# Patient Record
Sex: Male | Born: 1970 | Race: White | Hispanic: No | Marital: Married | Smoking: Current every day smoker
Health system: Southern US, Community
[De-identification: ages and names within clinical notes are randomized; demographics above are authoritative.]

## PROBLEM LIST (undated history)

## (undated) DIAGNOSIS — K649 Unspecified hemorrhoids: Secondary | ICD-10-CM

## (undated) DIAGNOSIS — K6289 Other specified diseases of anus and rectum: Secondary | ICD-10-CM

## (undated) DIAGNOSIS — K602 Anal fissure, unspecified: Secondary | ICD-10-CM

## (undated) HISTORY — PX: TONSILLECTOMY: SUR1361

## (undated) HISTORY — PX: SHOULDER ARTHROSCOPY: SHX128

## (undated) HISTORY — PX: TYMPANOSTOMY TUBE PLACEMENT: SHX32

## (undated) HISTORY — PX: LAPAROSCOPIC APPENDECTOMY: SHX408

---

## 2006-01-13 HISTORY — PX: VASECTOMY: SHX75

## 2009-11-05 ENCOUNTER — Ambulatory Visit (HOSPITAL_COMMUNITY): Admission: EM | Admit: 2009-11-05 | Discharge: 2009-11-06 | Payer: Self-pay | Admitting: Emergency Medicine

## 2010-03-27 LAB — URINALYSIS, ROUTINE W REFLEX MICROSCOPIC
Bilirubin Urine: NEGATIVE
Hgb urine dipstick: NEGATIVE
Protein, ur: NEGATIVE mg/dL
Urobilinogen, UA: 0.2 mg/dL (ref 0.0–1.0)

## 2010-03-27 LAB — BASIC METABOLIC PANEL
CO2: 28 mEq/L (ref 19–32)
Calcium: 9 mg/dL (ref 8.4–10.5)
Creatinine, Ser: 1.02 mg/dL (ref 0.4–1.5)
GFR calc Af Amer: >60 mL/min (ref >60)

## 2010-03-27 LAB — TYPE AND SCREEN: ABO/RH(D): O POS

## 2015-12-20 DIAGNOSIS — L989 Disorder of the skin and subcutaneous tissue, unspecified: Secondary | ICD-10-CM | POA: Diagnosis not present

## 2015-12-20 DIAGNOSIS — M549 Dorsalgia, unspecified: Secondary | ICD-10-CM | POA: Diagnosis not present

## 2015-12-20 DIAGNOSIS — F39 Unspecified mood [affective] disorder: Secondary | ICD-10-CM | POA: Diagnosis not present

## 2016-03-31 DIAGNOSIS — Z0000 Encounter for general adult medical examination without abnormal findings: Secondary | ICD-10-CM | POA: Diagnosis not present

## 2016-04-16 DIAGNOSIS — L820 Inflamed seborrheic keratosis: Secondary | ICD-10-CM | POA: Diagnosis not present

## 2016-04-16 DIAGNOSIS — L814 Other melanin hyperpigmentation: Secondary | ICD-10-CM | POA: Diagnosis not present

## 2016-04-16 DIAGNOSIS — L821 Other seborrheic keratosis: Secondary | ICD-10-CM | POA: Diagnosis not present

## 2016-06-11 DIAGNOSIS — F39 Unspecified mood [affective] disorder: Secondary | ICD-10-CM | POA: Diagnosis not present

## 2016-07-09 DIAGNOSIS — E291 Testicular hypofunction: Secondary | ICD-10-CM | POA: Diagnosis not present

## 2016-08-20 DIAGNOSIS — E291 Testicular hypofunction: Secondary | ICD-10-CM | POA: Diagnosis not present

## 2017-07-24 DIAGNOSIS — M549 Dorsalgia, unspecified: Secondary | ICD-10-CM | POA: Diagnosis not present

## 2017-07-24 DIAGNOSIS — F39 Unspecified mood [affective] disorder: Secondary | ICD-10-CM | POA: Diagnosis not present

## 2017-07-24 DIAGNOSIS — R42 Dizziness and giddiness: Secondary | ICD-10-CM | POA: Diagnosis not present

## 2017-08-17 DIAGNOSIS — N289 Disorder of kidney and ureter, unspecified: Secondary | ICD-10-CM | POA: Diagnosis not present

## 2017-12-18 DIAGNOSIS — L309 Dermatitis, unspecified: Secondary | ICD-10-CM | POA: Diagnosis not present

## 2018-02-12 DIAGNOSIS — L309 Dermatitis, unspecified: Secondary | ICD-10-CM | POA: Diagnosis not present

## 2018-07-19 DIAGNOSIS — L814 Other melanin hyperpigmentation: Secondary | ICD-10-CM | POA: Diagnosis not present

## 2018-07-19 DIAGNOSIS — L819 Disorder of pigmentation, unspecified: Secondary | ICD-10-CM | POA: Diagnosis not present

## 2018-07-19 DIAGNOSIS — L810 Postinflammatory hyperpigmentation: Secondary | ICD-10-CM | POA: Diagnosis not present

## 2018-07-19 DIAGNOSIS — L438 Other lichen planus: Secondary | ICD-10-CM | POA: Diagnosis not present

## 2018-07-26 DIAGNOSIS — Z0000 Encounter for general adult medical examination without abnormal findings: Secondary | ICD-10-CM | POA: Diagnosis not present

## 2018-08-09 DIAGNOSIS — K602 Anal fissure, unspecified: Secondary | ICD-10-CM | POA: Diagnosis not present

## 2018-08-09 DIAGNOSIS — K6289 Other specified diseases of anus and rectum: Secondary | ICD-10-CM | POA: Diagnosis not present

## 2018-08-09 DIAGNOSIS — Z13220 Encounter for screening for lipoid disorders: Secondary | ICD-10-CM | POA: Diagnosis not present

## 2018-08-09 DIAGNOSIS — R7309 Other abnormal glucose: Secondary | ICD-10-CM | POA: Diagnosis not present

## 2018-08-09 DIAGNOSIS — K625 Hemorrhage of anus and rectum: Secondary | ICD-10-CM | POA: Diagnosis not present

## 2018-08-09 DIAGNOSIS — Z125 Encounter for screening for malignant neoplasm of prostate: Secondary | ICD-10-CM | POA: Diagnosis not present

## 2018-09-06 DIAGNOSIS — K641 Second degree hemorrhoids: Secondary | ICD-10-CM | POA: Diagnosis not present

## 2018-09-06 DIAGNOSIS — K6289 Other specified diseases of anus and rectum: Secondary | ICD-10-CM | POA: Diagnosis not present

## 2018-09-06 DIAGNOSIS — K602 Anal fissure, unspecified: Secondary | ICD-10-CM | POA: Diagnosis not present

## 2018-10-04 DIAGNOSIS — K641 Second degree hemorrhoids: Secondary | ICD-10-CM | POA: Diagnosis not present

## 2018-10-04 DIAGNOSIS — K6289 Other specified diseases of anus and rectum: Secondary | ICD-10-CM | POA: Diagnosis not present

## 2018-10-04 DIAGNOSIS — K602 Anal fissure, unspecified: Secondary | ICD-10-CM | POA: Diagnosis not present

## 2018-10-12 DIAGNOSIS — K602 Anal fissure, unspecified: Secondary | ICD-10-CM | POA: Diagnosis not present

## 2018-10-18 ENCOUNTER — Ambulatory Visit: Payer: Self-pay | Admitting: General Surgery

## 2018-10-18 ENCOUNTER — Other Ambulatory Visit: Payer: Self-pay | Admitting: General Surgery

## 2018-10-18 NOTE — H&P (Signed)
History of Present Illness (Milyn Stapleton MD; 10/12/2018 11:25 AM) The patient is a 48 year old male who presents with anal pain. 48-year-old male who presents to the office with complaints of anal pain for the last 6 months. Patient has a history of an anal fissure approximately 8 years ago. He states that the pain was very similar. He saw his gastroenterologist, Dr. Hung. He is diltiazem ointments for approximately 2 months after being diagnosed with a fissure. He stated that his pain initially got better, but then he developed new pain and swelling in the area. He was seen again by his gastroenterologist and was given a prescription for a steroid cream for swelling and his hemorrhoids. He reports that he has had minimal success with this and he is ready for further evaluation.   Past Surgical History (Kelly Dockery, LPN; 10/12/2018 11:18 AM) Appendectomy  Oral Surgery  Vasectomy   Diagnostic Studies History (Kelly Dockery, LPN; 10/12/2018 11:18 AM) Colonoscopy  never  Allergies (Kelly Dockery, LPN; 10/12/2018 11:20 AM) Penicillins  Rash.  Medication History (Kelly Dockery, LPN; 10/12/2018 11:20 AM) buPROPion HCl ER (SR) (150MG Tablet ER 12HR, Oral) Active. Hydrocortisone (2.5% Cream, External) Active. Medications Reconciled  Social History (Kelly Dockery, LPN; 10/12/2018 11:18 AM) Alcohol use  Occasional alcohol use. Caffeine use  Carbonated beverages, Coffee, Tea. Illicit drug use  Remotely quit drug use. Tobacco use  Former smoker.  Family History (Kelly Dockery, LPN; 10/12/2018 11:18 AM) Alcohol Abuse  Father. Arthritis  Mother. Heart Disease  Father. Hypertension  Brother. Thyroid problems  Mother.  Other Problems (Kelly Dockery, LPN; 10/12/2018 11:18 AM) Hemorrhoids     Review of Systems (Kelly Dockery LPN; 10/12/2018 11:18 AM) General Not Present- Appetite Loss, Chills, Fatigue, Fever, Night Sweats, Weight Gain and Weight Loss. Skin Not Present-  Change in Wart/Mole, Dryness, Hives, Jaundice, New Lesions, Non-Healing Wounds, Rash and Ulcer. HEENT Not Present- Earache, Hearing Loss, Hoarseness, Nose Bleed, Oral Ulcers, Ringing in the Ears, Seasonal Allergies, Sinus Pain, Sore Throat, Visual Disturbances, Wears glasses/contact lenses and Yellow Eyes. Respiratory Not Present- Bloody sputum, Chronic Cough, Difficulty Breathing, Snoring and Wheezing. Breast Not Present- Breast Mass, Breast Pain, Nipple Discharge and Skin Changes. Gastrointestinal Present- Hemorrhoids and Rectal Pain. Not Present- Abdominal Pain, Bloating, Bloody Stool, Change in Bowel Habits, Chronic diarrhea, Constipation, Difficulty Swallowing, Excessive gas, Gets full quickly at meals, Indigestion, Nausea and Vomiting. Male Genitourinary Not Present- Blood in Urine, Change in Urinary Stream, Frequency, Impotence, Nocturia, Painful Urination, Urgency and Urine Leakage. Musculoskeletal Not Present- Back Pain, Joint Pain, Joint Stiffness, Muscle Pain, Muscle Weakness and Swelling of Extremities. Neurological Not Present- Decreased Memory, Fainting, Headaches, Numbness, Seizures, Tingling, Tremor, Trouble walking and Weakness. Psychiatric Not Present- Anxiety, Bipolar, Change in Sleep Pattern, Depression, Fearful and Frequent crying. Endocrine Not Present- Cold Intolerance, Excessive Hunger, Hair Changes, Heat Intolerance, Hot flashes and New Diabetes. Hematology Not Present- Blood Thinners, Easy Bruising, Excessive bleeding, Gland problems, HIV and Persistent Infections.  Vitals (Kelly Dockery LPN; 10/12/2018 11:18 AM) 10/12/2018 11:18 AM Weight: 200.8 lb Height: 72in Body Surface Area: 2.13 m Body Mass Index: 27.23 kg/m  Temp.: 97.7F (Temporal)  Pulse: 114 (Regular)  BP: 142/90(Sitting, Left Arm, Standard)       Physical Exam (Mylene Bow MD; 10/12/2018 11:33 AM) General Mental Status-Alert. General  Appearance-Cooperative.  Abdomen Palpation/Percussion Palpation and Percussion of the abdomen reveal - Soft and Non Tender.  Rectal Anorectal Exam External - anal fissure. Note: Anterior thrombosed hemorrhoid, minimal tenderness to palpation. Internal - increased sphincter   tone.    Assessment & Plan (Matteson Blue MD; 10/12/2018 11:36 AM)  ANAL FISSURE (K60.2) Impression: 48-year-old male who presents to the office with anal pain for the past 6 months. He has been diagnosed with an anal fissure. His symptoms initially resolved but then returned. On exam today, he does have a persistent posterior midline anal fissure as well as a thrombosed hemorrhoid anteriorly. We discussed exam under anesthesia with probable chemical sphincterotomy. We also discussed possible thrombectomy of his hemorrhoid, if it is still a concern at the time of surgery. Risk include postoperative pain, bleeding and recurrence. 

## 2018-11-15 DIAGNOSIS — K602 Anal fissure, unspecified: Secondary | ICD-10-CM | POA: Diagnosis not present

## 2018-11-17 DIAGNOSIS — R5383 Other fatigue: Secondary | ICD-10-CM | POA: Diagnosis not present

## 2018-12-27 DIAGNOSIS — R002 Palpitations: Secondary | ICD-10-CM | POA: Diagnosis not present

## 2018-12-27 DIAGNOSIS — I998 Other disorder of circulatory system: Secondary | ICD-10-CM | POA: Diagnosis not present

## 2019-01-22 ENCOUNTER — Other Ambulatory Visit (HOSPITAL_COMMUNITY): Payer: Self-pay

## 2019-01-25 DIAGNOSIS — K602 Anal fissure, unspecified: Secondary | ICD-10-CM | POA: Diagnosis not present

## 2019-01-28 ENCOUNTER — Ambulatory Visit: Payer: Self-pay | Admitting: General Surgery

## 2019-02-19 ENCOUNTER — Other Ambulatory Visit (HOSPITAL_COMMUNITY): Payer: Self-pay

## 2019-03-14 ENCOUNTER — Encounter (HOSPITAL_BASED_OUTPATIENT_CLINIC_OR_DEPARTMENT_OTHER): Payer: Self-pay | Admitting: General Surgery

## 2019-03-15 ENCOUNTER — Other Ambulatory Visit: Payer: Self-pay

## 2019-03-15 ENCOUNTER — Other Ambulatory Visit (HOSPITAL_COMMUNITY)
Admission: RE | Admit: 2019-03-15 | Discharge: 2019-03-15 | Disposition: A | Discharge status: Home / Self Care | Payer: BC Managed Care – PPO | Source: Ambulatory Visit | Attending: General Surgery | Admitting: General Surgery

## 2019-03-15 ENCOUNTER — Encounter (HOSPITAL_BASED_OUTPATIENT_CLINIC_OR_DEPARTMENT_OTHER): Payer: Self-pay | Admitting: General Surgery

## 2019-03-15 DIAGNOSIS — Z01812 Encounter for preprocedural laboratory examination: Secondary | ICD-10-CM | POA: Insufficient documentation

## 2019-03-15 DIAGNOSIS — Z20822 Contact with and (suspected) exposure to covid-19: Secondary | ICD-10-CM | POA: Diagnosis not present

## 2019-03-15 LAB — SARS CORONAVIRUS 2 (TAT 6-24 HRS): SARS Coronavirus 2: NEGATIVE

## 2019-03-15 NOTE — Progress Notes (Signed)
Spoke w/ via phone for pre-op interview--- PT Lab needs dos---- none              Lab results------ no COVID test ------ 03-02-201 @ 1500 Arrive at ------- 1045 NPO after ------ MN w/ exception clear liquids until 0630 then nothing by mouth (no cream/ milk products) Medications to take morning of surgery ----- Wellbutrin w/ sips of water Diabetic medication ----- n/a Patient Special Instructions ----- n/a Pre-Op special Istructions ----- pt surgery time change today, the time of arrival reflects this change Patient verbalized understanding of instructions that were given at this phone interview. Patient denies shortness of breath, chest pain, fever, cough a this phone interview.

## 2019-03-17 NOTE — Progress Notes (Addendum)
Adddendum 03/17/19 5:42 PM: Kevin Bishop w/ patient and confimed 0900 arrival time.  Called patient and left message notifying him of updated arrival time of 0930. Pt to remain NPO after MN w/ exception clear liquids until 0630 then nothing by mouth (no cream/ milk products).

## 2019-03-18 ENCOUNTER — Other Ambulatory Visit: Payer: Self-pay

## 2019-03-18 ENCOUNTER — Encounter (HOSPITAL_BASED_OUTPATIENT_CLINIC_OR_DEPARTMENT_OTHER)
Admission: RE | Disposition: A | Discharge status: Home / Self Care | Payer: Self-pay | Source: Home / Self Care | Attending: General Surgery

## 2019-03-18 ENCOUNTER — Ambulatory Visit (HOSPITAL_BASED_OUTPATIENT_CLINIC_OR_DEPARTMENT_OTHER): Payer: BC Managed Care – PPO | Admitting: Anesthesiology

## 2019-03-18 ENCOUNTER — Ambulatory Visit (HOSPITAL_BASED_OUTPATIENT_CLINIC_OR_DEPARTMENT_OTHER)
Admission: RE | Admit: 2019-03-18 | Discharge: 2019-03-18 | Disposition: A | Discharge status: Home / Self Care | Payer: BC Managed Care – PPO | Attending: General Surgery | Admitting: General Surgery

## 2019-03-18 ENCOUNTER — Encounter (HOSPITAL_BASED_OUTPATIENT_CLINIC_OR_DEPARTMENT_OTHER): Payer: Self-pay | Admitting: General Surgery

## 2019-03-18 DIAGNOSIS — K601 Chronic anal fissure: Secondary | ICD-10-CM | POA: Diagnosis not present

## 2019-03-18 DIAGNOSIS — Z79899 Other long term (current) drug therapy: Secondary | ICD-10-CM | POA: Insufficient documentation

## 2019-03-18 DIAGNOSIS — K602 Anal fissure, unspecified: Secondary | ICD-10-CM | POA: Diagnosis not present

## 2019-03-18 DIAGNOSIS — Z87891 Personal history of nicotine dependence: Secondary | ICD-10-CM | POA: Diagnosis not present

## 2019-03-18 DIAGNOSIS — K620 Anal polyp: Secondary | ICD-10-CM | POA: Diagnosis not present

## 2019-03-18 DIAGNOSIS — K621 Rectal polyp: Secondary | ICD-10-CM | POA: Diagnosis not present

## 2019-03-18 DIAGNOSIS — K645 Perianal venous thrombosis: Secondary | ICD-10-CM | POA: Diagnosis not present

## 2019-03-18 HISTORY — DX: Unspecified hemorrhoids: K64.9

## 2019-03-18 HISTORY — DX: Other specified diseases of anus and rectum: K62.89

## 2019-03-18 HISTORY — DX: Anal fissure, unspecified: K60.2

## 2019-03-18 SURGERY — EXAM UNDER ANESTHESIA
Anesthesia: Monitor Anesthesia Care | Site: Rectum

## 2019-03-18 MED ORDER — SODIUM CHLORIDE 0.9% FLUSH
3.0000 mL | Freq: Two times a day (BID) | INTRAVENOUS | Status: DC
  Filled 2019-03-18: qty 3

## 2019-03-18 MED ORDER — ACETAMINOPHEN 500 MG PO TABS
ORAL_TABLET | ORAL | Status: AC
  Filled 2019-03-18: qty 2

## 2019-03-18 MED ORDER — MIDAZOLAM HCL 2 MG/2ML IJ SOLN
INTRAMUSCULAR | Status: DC | PRN
  Administered 2019-03-18 (×2): 2 mg via INTRAVENOUS

## 2019-03-18 MED ORDER — FENTANYL CITRATE (PF) 100 MCG/2ML IJ SOLN
25.0000 ug | INTRAMUSCULAR | Status: DC | PRN
  Filled 2019-03-18: qty 1

## 2019-03-18 MED ORDER — ONDANSETRON HCL 4 MG/2ML IJ SOLN
INTRAMUSCULAR | Status: AC
  Filled 2019-03-18: qty 2

## 2019-03-18 MED ORDER — GABAPENTIN 300 MG PO CAPS
ORAL_CAPSULE | ORAL | Status: AC
  Filled 2019-03-18: qty 1

## 2019-03-18 MED ORDER — ONDANSETRON HCL 4 MG/2ML IJ SOLN
4.0000 mg | Freq: Once | INTRAMUSCULAR | Status: DC | PRN
  Filled 2019-03-18: qty 2

## 2019-03-18 MED ORDER — ONDANSETRON HCL 4 MG/2ML IJ SOLN
INTRAMUSCULAR | Status: DC | PRN
  Administered 2019-03-18: 4 mg via INTRAVENOUS

## 2019-03-18 MED ORDER — BUPIVACAINE-EPINEPHRINE 0.5% -1:200000 IJ SOLN
INTRAMUSCULAR | Status: DC | PRN
  Administered 2019-03-18: 30 mL

## 2019-03-18 MED ORDER — PROPOFOL 500 MG/50ML IV EMUL
INTRAVENOUS | Status: DC | PRN
  Administered 2019-03-18: 200 ug/kg/min via INTRAVENOUS

## 2019-03-18 MED ORDER — OXYCODONE HCL 5 MG/5ML PO SOLN
5.0000 mg | Freq: Once | ORAL | Status: DC | PRN
  Filled 2019-03-18: qty 5

## 2019-03-18 MED ORDER — CELECOXIB 200 MG PO CAPS
ORAL_CAPSULE | ORAL | Status: AC
  Filled 2019-03-18: qty 1

## 2019-03-18 MED ORDER — GABAPENTIN 300 MG PO CAPS
300.0000 mg | ORAL_CAPSULE | ORAL | Status: AC
  Administered 2019-03-18: 300 mg via ORAL
  Filled 2019-03-18: qty 1

## 2019-03-18 MED ORDER — MIDAZOLAM HCL 2 MG/2ML IJ SOLN
INTRAMUSCULAR | Status: AC
  Filled 2019-03-18: qty 4

## 2019-03-18 MED ORDER — OXYCODONE HCL 5 MG PO TABS: 5.0000 mg | ORAL_TABLET | Freq: Four times a day (QID) | ORAL | 0 refills | Status: AC | PRN

## 2019-03-18 MED ORDER — SODIUM CHLORIDE 0.9 % IV SOLN
INTRAVENOUS | Status: DC | PRN
  Administered 2019-03-18: 20 ug/kg/min via INTRAVENOUS

## 2019-03-18 MED ORDER — LACTATED RINGERS IV SOLN
INTRAVENOUS | Status: DC
  Administered 2019-03-18: 50 mL/h via INTRAVENOUS
  Filled 2019-03-18: qty 1000

## 2019-03-18 MED ORDER — ACETAMINOPHEN 500 MG PO TABS
1000.0000 mg | ORAL_TABLET | ORAL | Status: AC
  Administered 2019-03-18: 1000 mg via ORAL
  Filled 2019-03-18: qty 2

## 2019-03-18 MED ORDER — OXYCODONE HCL 5 MG PO TABS
5.0000 mg | ORAL_TABLET | Freq: Once | ORAL | Status: DC | PRN
  Filled 2019-03-18: qty 1

## 2019-03-18 MED ORDER — SODIUM CHLORIDE (PF) 0.9 % IJ SOLN
INTRAMUSCULAR | Status: DC | PRN
  Administered 2019-03-18: 50 mL

## 2019-03-18 MED ORDER — ONABOTULINUMTOXINA 100 UNITS IJ SOLR
INTRAMUSCULAR | Status: DC | PRN
  Administered 2019-03-18: 100 [IU] via INTRAMUSCULAR

## 2019-03-18 MED ORDER — CELECOXIB 200 MG PO CAPS
200.0000 mg | ORAL_CAPSULE | ORAL | Status: AC
  Administered 2019-03-18: 200 mg via ORAL
  Filled 2019-03-18: qty 1

## 2019-03-18 SURGICAL SUPPLY — 58 items
APL SKNCLS STERI-STRIP NONHPOA (GAUZE/BANDAGES/DRESSINGS) ×1
BENZOIN TINCTURE PRP APPL 2/3 (GAUZE/BANDAGES/DRESSINGS) ×2 IMPLANT
BLADE EXTENDED COATED 6.5IN (ELECTRODE) IMPLANT
BLADE HEX COATED 2.75 (ELECTRODE) ×2 IMPLANT
BLADE SURG 10 STRL SS (BLADE) IMPLANT
BRIEF STRETCH FOR OB PAD LRG (UNDERPADS AND DIAPERS) ×2 IMPLANT
CANISTER SUCT 3000ML PPV (MISCELLANEOUS) ×1 IMPLANT
COVER BACK TABLE 60X90IN (DRAPES) ×2 IMPLANT
COVER MAYO STAND STRL (DRAPES) ×2 IMPLANT
COVER WAND RF STERILE (DRAPES) ×3 IMPLANT
DRAPE HYSTEROSCOPY (DRAPE) IMPLANT
DRAPE LAPAROTOMY 100X72 PEDS (DRAPES) ×2 IMPLANT
DRAPE SHEET LG 3/4 BI-LAMINATE (DRAPES) IMPLANT
DRAPE UTILITY XL STRL (DRAPES) ×2 IMPLANT
DRSG PAD ABDOMINAL 8X10 ST (GAUZE/BANDAGES/DRESSINGS) ×2 IMPLANT
ELECT REM PT RETURN 9FT ADLT (ELECTROSURGICAL) ×2
ELECTRODE REM PT RTRN 9FT ADLT (ELECTROSURGICAL) ×1 IMPLANT
GAUZE SPONGE 4X4 12PLY STRL (GAUZE/BANDAGES/DRESSINGS) ×1 IMPLANT
GAUZE SPONGE 4X4 8PLY STR LF (GAUZE/BANDAGES/DRESSINGS) ×1 IMPLANT
GLOVE BIO SURGEON STRL SZ 6 (GLOVE) ×1 IMPLANT
GLOVE BIO SURGEON STRL SZ 6.5 (GLOVE) ×3 IMPLANT
GLOVE BIO SURGEON STRL SZ8 (GLOVE) ×1 IMPLANT
GLOVE BIOGEL PI IND STRL 7.0 (GLOVE) ×1 IMPLANT
GLOVE BIOGEL PI IND STRL 8.5 (GLOVE) IMPLANT
GLOVE BIOGEL PI INDICATOR 7.0 (GLOVE) ×2
GLOVE BIOGEL PI INDICATOR 8.5 (GLOVE) ×1
GOWN STRL REUS W/TWL LRG LVL3 (GOWN DISPOSABLE) ×2 IMPLANT
GOWN STRL REUS W/TWL XL LVL3 (GOWN DISPOSABLE) ×3 IMPLANT
HYDROGEN PEROXIDE 16OZ (MISCELLANEOUS) IMPLANT
IV CATH 14GX2 1/4 (CATHETERS) IMPLANT
IV CATH 18G SAFETY (IV SOLUTION) IMPLANT
KIT SIGMOIDOSCOPE (SET/KITS/TRAYS/PACK) IMPLANT
KIT TURNOVER CYSTO (KITS) ×2 IMPLANT
LEGGING LITHOTOMY PAIR STRL (DRAPES) IMPLANT
LOOP VESSEL MAXI BLUE (MISCELLANEOUS) IMPLANT
NEEDLE HYPO 22GX1.5 SAFETY (NEEDLE) ×2 IMPLANT
NS IRRIG 500ML POUR BTL (IV SOLUTION) ×2 IMPLANT
PACK BASIN DAY SURGERY FS (CUSTOM PROCEDURE TRAY) ×2 IMPLANT
PAD ARMBOARD 7.5X6 YLW CONV (MISCELLANEOUS) ×1 IMPLANT
PAD PREP 24X48 CUFFED NSTRL (MISCELLANEOUS) IMPLANT
PENCIL BUTTON HOLSTER BLD 10FT (ELECTRODE) ×2 IMPLANT
SPONGE HEMORRHOID 8X3CM (HEMOSTASIS) IMPLANT
SPONGE SURGIFOAM ABS GEL 100 (HEMOSTASIS) IMPLANT
SPONGE SURGIFOAM ABS GEL 12-7 (HEMOSTASIS) IMPLANT
SUT CHROMIC 2 0 SH (SUTURE) IMPLANT
SUT CHROMIC 3 0 SH 27 (SUTURE) ×2 IMPLANT
SUT ETHIBOND 0 (SUTURE) IMPLANT
SUT VIC AB 2-0 SH 27 (SUTURE)
SUT VIC AB 2-0 SH 27XBRD (SUTURE) IMPLANT
SUT VIC AB 3-0 SH 27 (SUTURE)
SUT VIC AB 3-0 SH 27XBRD (SUTURE) IMPLANT
SYR 10ML LL (SYRINGE) ×1 IMPLANT
SYR CONTROL 10ML LL (SYRINGE) ×2 IMPLANT
TOWEL OR 17X26 10 PK STRL BLUE (TOWEL DISPOSABLE) ×2 IMPLANT
TRAY DSU PREP LF (CUSTOM PROCEDURE TRAY) ×2 IMPLANT
TUBE CONNECTING 12X1/4 (SUCTIONS) ×2 IMPLANT
UNDERPAD 30X30 (UNDERPADS AND DIAPERS) ×2 IMPLANT
YANKAUER SUCT BULB TIP NO VENT (SUCTIONS) ×2 IMPLANT

## 2019-03-18 NOTE — H&P (Addendum)
The patient is a 49 year old male who presents with anal pain. 49 year old male who presents to the office with complaints of anal pain for the last 6 months.  Patient has a history of an anal fissure approximately 8 years ago.  He states that the pain was very similar.  He saw his gastroenterologist, Dr. Elnoria Howard.  He used diltiazem ointment for approximately 2 months after being diagnosed with a fissure.  He stated that his pain initially got better, but then he developed new pain and swelling in the area.  He was seen again by his gastroenterologist and was given a prescription for a steroid cream for swelling and his hemorrhoids.  He reports that he has had minimal success with this and he is ready for further evaluation.     Past Surgical History Appendectomy   Oral Surgery   Vasectomy     Diagnostic Studies History () Colonoscopy   never   Allergies  Penicillins   Rash.   Medication History buPROPion HCl ER (SR)  (150MG  Tablet ER 12HR, Oral) Active. Hydrocortisone  (2.5% Cream, External) Active. Medications Reconciled    Social History Alcohol use   Occasional alcohol use. Caffeine use   Carbonated beverages, Coffee, Tea. Illicit drug use   Remotely quit drug use. Tobacco use   Former smoker.   Family History  Alcohol Abuse   Father. Arthritis   Mother. Heart Disease   Father. Hypertension   Brother. Thyroid problems   Mother.   Other Problems  Hemorrhoids         Review of Systems  General Not Present- Appetite Loss, Chills, Fatigue, Fever, Night Sweats, Weight Gain and Weight Loss. Skin Not Present- Change in Wart/Mole, Dryness, Hives, Jaundice, New Lesions, Non-Healing Wounds, Rash and Ulcer. HEENT Not Present- Earache, Hearing Loss, Hoarseness, Nose Bleed, Oral Ulcers, Ringing in the Ears, Seasonal Allergies, Sinus Pain, Sore Throat, Visual Disturbances, Wears glasses/contact lenses and Yellow Eyes. Respiratory Not Present- Bloody sputum, Chronic Cough, Difficulty  Breathing, Snoring and Wheezing. Breast Not Present- Breast Mass, Breast Pain, Nipple Discharge and Skin Changes. Gastrointestinal Present- Hemorrhoids and Rectal Pain. Not Present- Abdominal Pain, Bloating, Bloody Stool, Change in Bowel Habits, Chronic diarrhea, Constipation, Difficulty Swallowing, Excessive gas, Gets full quickly at meals, Indigestion, Nausea and Vomiting. Male Genitourinary Not Present- Blood in Urine, Change in Urinary Stream, Frequency, Impotence, Nocturia, Painful Urination, Urgency and Urine Leakage. Musculoskeletal Not Present- Back Pain, Joint Pain, Joint Stiffness, Muscle Pain, Muscle Weakness and Swelling of Extremities. Neurological Not Present- Decreased Memory, Fainting, Headaches, Numbness, Seizures, Tingling, Tremor, Trouble walking and Weakness. Psychiatric Not Present- Anxiety, Bipolar, Change in Sleep Pattern, Depression, Fearful and Frequent crying. Endocrine Not Present- Cold Intolerance, Excessive Hunger, Hair Changes, Heat Intolerance, Hot flashes and New Diabetes. Hematology Not Present- Blood Thinners, Easy Bruising, Excessive bleeding, Gland problems, HIV and Persistent Infections.   BP (!) 141/98   Pulse 98   Temp 98.2 F (36.8 C) (Oral)   Resp 18   Ht 6' (1.829 m)   Wt 86.3 kg   SpO2 99%   BMI 25.81 kg/m         Physical Exam  General Mental Status - Alert. General Appearance - Cooperative.  CV: RRR Lungs: CTA Abdomen Palpation/Percussion Palpation and Percussion of the abdomen reveal - Soft and Non Tender.         Assessment & Plan    ANAL FISSURE (K60.2) Impression: 49 year old male who presents to the office with anal pain for the past 6  months. He has been diagnosed with an anal fissure. His symptoms initially resolved but then returned. On exam in the past, he has had a persistent posterior midline anal fissure as well as a thrombosed hemorrhoid anteriorly. We discussed exam under anesthesia with probable chemical  sphincterotomy. We also discussed possible thrombectomy or excision of his hemorrhoid.  He is currently having R sided pain with BM's.  Risks of surgery include postoperative pain, bleeding and recurrence.

## 2019-03-18 NOTE — Discharge Instructions (Addendum)
ANORECTAL SURGERY: POST OP INSTRUCTIONS 1. Take your usually prescribed home medications unless otherwise directed. 2. DIET: During the first few hours after surgery sip on some liquids until you are able to urinate.  It is normal to not urinate for several hours after this surgery.  If you feel uncomfortable, please contact the office for instructions.  After you are able to urinate,you may eat, if you feel like it.  Follow a light bland diet the first 24 hours after arrival home, such as soup, liquids, crackers, etc.  Be sure to include lots of fluids daily (6-8 glasses).  Avoid fast food or heavy meals, as your are more likely to get nauseated.  Eat a low fat diet the next few days after surgery.  Limit caffeine intake to 1-2 servings a day. 3. PAIN CONTROL: a. Pain is best controlled by a usual combination of several different methods TOGETHER: i. Muscle relaxation: Soak in a warm bath (or Sitz bath) three times a day and after bowel movements.  Continue to do this until all pain is resolved. ii. Over the counter pain medication iii. Prescription pain medication b. Most patients will experience some swelling and discomfort in the anus/rectal area and incisions.  Heat such as warm towels, sitz baths, warm baths, etc to help relax tight/sore spots and speed recovery.  Some people prefer to use ice, especially in the first couple days after surgery, as it may decrease the pain and swelling, or alternate between ice & heat.  Experiment to what works for you.  Swelling and bruising can take several weeks to resolve.  Pain can take even longer to completely resolve. c. It is helpful to take an over-the-counter pain medication regularly for the first few weeks.  Choose one of the following that works best for you: i. Naproxen (Aleve, etc)  Two 220mg tabs twice a day ii. Ibuprofen (Advil, etc) Three 200mg tabs four times a day (every meal & bedtime) d. A  prescription for pain medication (such as percocet,  oxycodone, hydrocodone, etc) should be given to you upon discharge.  Take your pain medication as prescribed.  i. If you are having problems/concerns with the prescription medicine (does not control pain, nausea, vomiting, rash, itching, etc), please call us [phone] to see if we need to switch you to a different pain medicine that will work better for you and/or control your side effect better. ii. If you need a refill on your pain medication, please contact your pharmacy.  They will contact our office to request authorization. Prescriptions will not be filled after 5 pm or on week-ends. 4. KEEP YOUR BOWELS REGULAR and AVOID CONSTIPATION a. The goal is one to two soft bowel movements a day.  You should at least have a bowel movement every other day. b. Avoid getting constipated.  Between the surgery and the pain medications, it is common to experience some constipation. This can be very painful after rectal surgery.  Increasing fluid intake and taking a fiber supplement (such as Metamucil, Citrucel, FiberCon, etc) 1-2 times a day regularly will usually help prevent this problem from occurring.  A stool softener like colace is also recommended.  This can be purchased over the counter at your pharmacy.  You can take it up to 3 times a day.  If you do not have a bowel movement after 24 hrs since your surgery, take one does of milk of magnesia.  If you still haven't had a bowel movement 8-12 hours after   that dose, take another dose.  If you don't have a bowel movement 48 hrs after surgery, purchase a Fleets enema from the drug store and administer gently per package instructions.  If you still are having trouble with your bowel movements after that, please call the office for further instructions. c. If you develop diarrhea or have many loose bowel movements, simplify your diet to bland foods & liquids for a few days.  Stop any stool softeners and decrease your fiber supplement.  Switching to mild  anti-diarrheal medications (Kayopectate, Pepto Bismol) can help.  If this worsens or does not improve, please call us.  5. Wound Care a. Remove your bandages before your first bowel movement or 8 hours after surgery.     b. Remove any wound packing material at this tim,e as well.  You do not need to repack the wound unless instructed otherwise.  Wear an absorbent pad or soft cotton gauze in your underwear to catch any drainage and help keep the area clean. You should change this every 2-3 hours while awake. c. Keep the area clean and dry.  Bathe / shower every day, especially after bowel movements.  Keep the area clean by showering / bathing over the incision / wound.   It is okay to soak an open wound to help wash it.  Wet wipes or showers / gentle washing after bowel movements is often less traumatic than regular toilet paper. d. You may have some styrofoam-like soft packing in the rectum which will come out with the first bowel movement.  e. You will often notice bleeding with bowel movements.  This should slow down by the end of the first week of surgery f. Expect some drainage.  This should slow down, too, by the end of the first week of surgery.  Wear an absorbent pad or soft cotton gauze in your underwear until the drainage stops. g. Do Not sit on a rubber or pillow ring.  This can make you symptoms worse.  You may sit on a soft pillow if needed.  6. ACTIVITIES as tolerated:   a. You may resume regular (light) daily activities beginning the next day--such as daily self-care, walking, climbing stairs--gradually increasing activities as tolerated.  If you can walk 30 minutes without difficulty, it is safe to try more intense activity such as jogging, treadmill, bicycling, low-impact aerobics, swimming, etc. b. Save the most intensive and strenuous activity for last such as sit-ups, heavy lifting, contact sports, etc  Refrain from any heavy lifting or straining until you are off narcotics for pain  control.   c. You may drive when you are no longer taking prescription pain medication, you can comfortably sit for long periods of time, and you can safely maneuver your car and apply brakes. d. You may have sexual intercourse when it is comfortable.  7. FOLLOW UP in our office a. Please call [facility] at [phone] to set up an appointment to see your surgeon in the office for a follow-up appointment approximately 3-4 weeks after your surgery. b. Make sure that you call for this appointment the day you arrive home to insure a convenient appointment time. 10. IF YOU HAVE DISABILITY OR FAMILY LEAVE FORMS, BRING THEM TO THE OFFICE FOR PROCESSING.  DO NOT GIVE THEM TO YOUR DOCTOR.     WHEN TO CALL US [phone]: 1. Poor pain control 2. Reactions / problems with new medications (rash/itching, nausea, etc)  3. Fever over 101.5 F (38.5 C) 4.   Inability to urinate 5. Nausea and/or vomiting 6. Worsening swelling or bruising 7. Continued bleeding from incision. 8. Increased pain, redness, or drainage from the incision  The clinic staff is available to answer your questions during regular business hours (8:30am-5pm).  Please don't hesitate to call and ask to speak to one of our nurses for clinical concerns.   A surgeon from [facility] is always on call at the hospitals   If you have a medical emergency, go to the nearest emergency room or call 911.    [facility] [address], [address], [place], [place]  [address] ? MAIN: [phone] ? TOLL FREE: [phone] ? FAX [phone] [email or web address]     Post Anesthesia Home Care Instructions  Activity: Get plenty of rest for the remainder of the day. A responsible individual must stay with you for 24 hours following the procedure.  For the next 24 hours, DO NOT: -Drive a car -Advertising copywriter -Drink alcoholic beverages -Take any medication unless instructed by your  physician -Make any legal decisions or sign important papers.  Meals: Start with liquid foods such as gelatin or soup. Progress to regular foods as tolerated. Avoid greasy, spicy, heavy foods. If nausea and/or vomiting occur, drink only clear liquids until the nausea and/or vomiting subsides. Call your physician if vomiting continues.  Special Instructions/Symptoms: Your throat may feel dry or sore from the anesthesia or the breathing tube placed in your throat during surgery. If this causes discomfort, gargle with warm salt water. The discomfort should disappear within 24 hours.  Do not take any Tylenol until after 4:oo pm today.

## 2019-03-18 NOTE — Op Note (Signed)
03/18/2019  12:07 PM  PATIENT:  Kevin Bishop  49 y.o. male  Patient Care Team: [place], [place] as PCP - General (Family Medicine)  PRE-OPERATIVE DIAGNOSIS:  ANAL FISSURE, CHRONIC  POST-OPERATIVE DIAGNOSIS:  ANAL FISSURE, CHRONIC, ANAL POLYP  PROCEDURE:  ANAL EXAM UNDER ANESTHESIA, CHEMICAL SPHINCTEROTOMY (BOTOX), EXCISION OF ANAL POLYP   Surgeon(s): Leighton Ruff, MD  ASSISTANT: none   ANESTHESIA:   local and MAC  SPECIMEN:  Source of Specimen:  anal polyp  DISPOSITION OF SPECIMEN:  PATHOLOGY  COUNTS:  YES  PLAN OF CARE: Discharge to home after PACU  PATIENT DISPOSITION:  PACU - hemodynamically stable.  INDICATION: 49 y.o. M with chronic anal pain for several months.  He has tried medical management of this but has not gotten any lasting relief.  I recommended an EUA with possible chemical sphincterotomy.   OR FINDINGS: large posterior midline anal polyp, large partially healed anal fissure, significant deep anal sphincter hypertension.    DESCRIPTION: the patient was identified in the preoperative holding area and taken to the OR where they were laid on the operating room table.  MAC anesthesia was induced without difficulty. The patient was then positioned in prone jackknife position with buttocks gently taped apart.  The patient was then prepped and draped in usual sterile fashion.  SCDs were noted to be in place prior to the initiation of anesthesia. A surgical timeout was performed indicating the correct patient, procedure, positioning and need for preoperative antibiotics.  A rectal block was performed using Marcaine with epinephrine.    I began with a digital rectal exam.  A polypoid mass could be palpated at posterior midline.  I gently dilated the anal canal.  There was significant sphincter hypertension noted at the proximal anal canal.  I then placed a Hill-Ferguson anoscope into the anal canal and evaluated this completely.  There was a large  partially healed anal fissure within the posterior midline at the proximal anal canal.  At the proximal edge of this was a pedunculated anal polyp that appeared inflammatory in nature.  This was resected using Metzenbaum scissors.  I then reapproximated the mucosa using a 3-0 chromic suture.  I then injected 100 units of Botox into the intersphincteric space circumferentially.  I inspected the anal canal afterwards.  Hemostasis was good.  A sterile dressing was then applied.  The patient was awakened from anesthesia and sent to the postanesthesia care unit in stable condition.  All counts were correct per operating room staff.

## 2019-03-18 NOTE — Anesthesia Procedure Notes (Signed)
Procedure Name: MAC Date/Time: 03/18/2019 11:41 AM Performed by: Wanita Chamberlain, CRNA Pre-anesthesia Checklist: Patient identified, Emergency Drugs available, Suction available and Patient being monitored Patient Re-evaluated:Patient Re-evaluated prior to induction Oxygen Delivery Method: Nasal cannula Induction Type: IV induction Placement Confirmation: CO2 detector,  breath sounds checked- equal and bilateral and positive ETCO2 Dental Injury: Teeth and Oropharynx as per pre-operative assessment

## 2019-03-18 NOTE — Anesthesia Postprocedure Evaluation (Signed)
Anesthesia Post Note  Patient: Kevin Bishop  Procedure(s) Performed: ANAL EXAM UNDER ANESTHESIA, CHEMICAL SPHINCTEROTOMY (BOTOX), EXCISION ANAL CANAL POLYP  (N/A Rectum)     Patient location during evaluation: PACU Anesthesia Type: MAC Level of consciousness: awake and alert Pain management: pain level controlled Vital Signs Assessment: post-procedure vital signs reviewed and stable Respiratory status: spontaneous breathing, nonlabored ventilation and respiratory function stable Cardiovascular status: blood pressure returned to baseline and stable Postop Assessment: no apparent nausea or vomiting Anesthetic complications: no    Last Vitals:  Vitals:   03/18/19 1230 03/18/19 1245  BP: 120/70   Pulse: 86 95  Resp: 12 17  Temp:    SpO2: 100% 99%    Last Pain:  Vitals:   03/18/19 1245  TempSrc:   PainSc: 0-No pain                 Lidia Collum

## 2019-03-18 NOTE — Progress Notes (Signed)
Patient and girlfriend educated on use of sitz bath.

## 2019-03-18 NOTE — Anesthesia Preprocedure Evaluation (Signed)
Anesthesia Evaluation  Patient identified by MRN, date of birth, ID band Patient awake    Reviewed: Allergy & Precautions, NPO status , Patient's Chart, lab work & pertinent test results  History of Anesthesia Complications Negative for: history of anesthetic complications  Airway Mallampati: II  TM Distance: >3 FB Neck ROM: Full    Dental  (+) Teeth Intact   Pulmonary neg pulmonary ROS, former smoker,    Pulmonary exam normal        Cardiovascular negative cardio ROS Normal cardiovascular exam     Neuro/Psych negative neurological ROS  negative psych ROS   GI/Hepatic Neg liver ROS, hemorrhoids   Endo/Other  negative endocrine ROS  Renal/GU negative Renal ROS  negative genitourinary   Musculoskeletal negative musculoskeletal ROS (+)   Abdominal   Peds  Hematology negative hematology ROS (+)   Anesthesia Other Findings   Reproductive/Obstetrics                           Anesthesia Physical Anesthesia Plan  ASA: I  Anesthesia Plan: MAC   Post-op Pain Management:    Induction: Intravenous  PONV Risk Score and Plan: 1 and Propofol infusion, TIVA and Treatment may vary due to age or medical condition  Airway Management Planned: Natural Airway, Nasal Cannula and Simple Face Mask  Additional Equipment: None  Intra-op Plan:   Post-operative Plan:   Informed Consent: I have reviewed the patients History and Physical, chart, labs and discussed the procedure including the risks, benefits and alternatives for the proposed anesthesia with the patient or authorized representative who has indicated his/her understanding and acceptance.       Plan Discussed with:   Anesthesia Plan Comments:        Anesthesia Quick Evaluation

## 2019-03-18 NOTE — Transfer of Care (Signed)
Immediate Anesthesia Transfer of Care Note  Patient: Kevin Bishop  Procedure(s) Performed: ANAL EXAM UNDER ANESTHESIA, CHEMICAL SPHINCTEROTOMY (BOTOX), EXCISION ANAL CANAL POLYP  (N/A Rectum)  Patient Location: PACU  Anesthesia Type:MAC  Level of Consciousness: sedated, drowsy and patient cooperative  Airway & Oxygen Therapy: Patient Spontanous Breathing and Patient connected to nasal cannula oxygen  Post-op Assessment: Report given to RN and Post -op Vital signs reviewed and stable  Post vital signs: Reviewed and stable  Last Vitals:  Vitals Value Taken Time  BP    Temp    Pulse 92 03/18/19 1220  Resp    SpO2 99 % 03/18/19 1220  Vitals shown include unvalidated device data.  Last Pain:  Vitals:   03/18/19 1017  TempSrc: Oral  PainSc: 4       Patients Stated Pain Goal: 4 (91/79/15 0569)  Complications: No apparent anesthesia complications

## 2019-03-21 LAB — SURGICAL PATHOLOGY

## 2019-05-12 DIAGNOSIS — H1013 Acute atopic conjunctivitis, bilateral: Secondary | ICD-10-CM | POA: Diagnosis not present

## 2019-05-12 DIAGNOSIS — H579 Unspecified disorder of eye and adnexa: Secondary | ICD-10-CM | POA: Diagnosis not present

## 2019-08-08 DIAGNOSIS — Z13220 Encounter for screening for lipoid disorders: Secondary | ICD-10-CM | POA: Diagnosis not present

## 2019-08-08 DIAGNOSIS — Z125 Encounter for screening for malignant neoplasm of prostate: Secondary | ICD-10-CM | POA: Diagnosis not present

## 2019-08-08 DIAGNOSIS — Z0000 Encounter for general adult medical examination without abnormal findings: Secondary | ICD-10-CM | POA: Diagnosis not present

## 2019-08-22 DIAGNOSIS — Z1211 Encounter for screening for malignant neoplasm of colon: Secondary | ICD-10-CM | POA: Diagnosis not present

## 2019-09-30 ENCOUNTER — Emergency Department (HOSPITAL_BASED_OUTPATIENT_CLINIC_OR_DEPARTMENT_OTHER): Payer: BC Managed Care – PPO

## 2019-09-30 ENCOUNTER — Encounter (HOSPITAL_BASED_OUTPATIENT_CLINIC_OR_DEPARTMENT_OTHER): Payer: Self-pay

## 2019-09-30 ENCOUNTER — Other Ambulatory Visit: Payer: Self-pay

## 2019-09-30 ENCOUNTER — Emergency Department (HOSPITAL_BASED_OUTPATIENT_CLINIC_OR_DEPARTMENT_OTHER)
Admission: EM | Admit: 2019-09-30 | Discharge: 2019-09-30 | Disposition: A | Discharge status: Home / Self Care | Payer: BC Managed Care – PPO | Attending: Emergency Medicine | Admitting: Emergency Medicine

## 2019-09-30 DIAGNOSIS — R079 Chest pain, unspecified: Secondary | ICD-10-CM | POA: Diagnosis not present

## 2019-09-30 DIAGNOSIS — R0789 Other chest pain: Secondary | ICD-10-CM

## 2019-09-30 DIAGNOSIS — Z79899 Other long term (current) drug therapy: Secondary | ICD-10-CM | POA: Insufficient documentation

## 2019-09-30 DIAGNOSIS — Z87891 Personal history of nicotine dependence: Secondary | ICD-10-CM | POA: Diagnosis not present

## 2019-09-30 LAB — BASIC METABOLIC PANEL
Anion gap: 10 (ref 5–15)
BUN: 10 mg/dL (ref 6–20)
CO2: 25 mmol/L (ref 22–32)
Calcium: 9.2 mg/dL (ref 8.9–10.3)
Chloride: 105 mmol/L (ref 98–111)
Creatinine, Ser: 1.14 mg/dL (ref 0.61–1.24)
GFR calc Af Amer: >60 mL/min (ref >60)
GFR calc non Af Amer: >60 mL/min (ref >60)
Glucose, Bld: 104 mg/dL — ABNORMAL HIGH (ref 70–99)
Potassium: 3.8 mmol/L (ref 3.5–5.1)
Sodium: 140 mmol/L (ref 135–145)

## 2019-09-30 LAB — CBC WITH DIFFERENTIAL/PLATELET
Abs Immature Granulocytes: 0.02 10*3/uL (ref 0.00–0.07)
Basophils Absolute: 0 10*3/uL (ref 0.0–0.1)
Basophils Relative: 1 %
Eosinophils Absolute: 0.1 10*3/uL (ref 0.0–0.5)
Eosinophils Relative: 1 %
HCT: 44.9 % (ref 39.0–52.0)
Hemoglobin: 15.9 g/dL (ref 13.0–17.0)
Immature Granulocytes: 0 %
Lymphocytes Relative: 14 %
Lymphs Abs: 0.9 10*3/uL (ref 0.7–4.0)
MCH: 32.5 pg (ref 26.0–34.0)
MCHC: 35.4 g/dL (ref 30.0–36.0)
MCV: 91.8 fL (ref 80.0–100.0)
Monocytes Absolute: 0.4 10*3/uL (ref 0.1–1.0)
Monocytes Relative: 6 %
Neutro Abs: 4.9 10*3/uL (ref 1.7–7.7)
Neutrophils Relative %: 78 %
Platelets: 150 10*3/uL (ref 150–400)
RBC: 4.89 MIL/uL (ref 4.22–5.81)
RDW: 11.3 % — ABNORMAL LOW (ref 11.5–15.5)
WBC: 6.3 10*3/uL (ref 4.0–10.5)
nRBC: 0 % (ref 0.0–0.2)

## 2019-09-30 LAB — D-DIMER, QUANTITATIVE: D-Dimer, Quant: <0.27 ug/mL-FEU (ref 0.00–0.50)

## 2019-09-30 LAB — HEPATIC FUNCTION PANEL
ALT: 53 U/L — ABNORMAL HIGH (ref 0–44)
AST: 28 U/L (ref 15–41)
Albumin: 4.6 g/dL (ref 3.5–5.0)
Alkaline Phosphatase: 57 U/L (ref 38–126)
Bilirubin, Direct: 0.2 mg/dL (ref 0.0–0.2)
Indirect Bilirubin: 1 mg/dL — ABNORMAL HIGH (ref 0.3–0.9)
Total Bilirubin: 1.2 mg/dL (ref 0.3–1.2)
Total Protein: 7.3 g/dL (ref 6.5–8.1)

## 2019-09-30 LAB — TROPONIN I (HIGH SENSITIVITY)
Troponin I (High Sensitivity): <2 ng/L (ref <18)
Troponin I (High Sensitivity): <2 ng/L (ref <18)

## 2019-09-30 LAB — LIPASE, BLOOD: Lipase: 26 U/L (ref 11–51)

## 2019-09-30 NOTE — ED Provider Notes (Signed)
[facility] EMERGENCY DEPARTMENT Provider Note   CSN: [id number] Arrival date & time: 09/30/19  1014     History Chief Complaint  Patient presents with   Chest Pain    Kevin Bishop is a 49 y.o. male.  The history is provided by the patient.  Chest Pain Pain location:  L chest Pain quality: aching   Pain radiates to:  Does not radiate Pain severity:  Mild Progression:  Resolved Context: at rest   Relieved by:  Nothing Worsened by:  Nothing Associated symptoms: no abdominal pain, no back pain, no cough, no fever, no palpitations, no shortness of breath and no vomiting   Associated symptoms comment:  Felt like having gas pains, as after passing gas pain improved. Patient also with some loose stools. Left side chest pain for a few minutes last night and this morning. No SOB.  Risk factors: no coronary artery disease, no high cholesterol, no hypertension and no prior DVT/PE        Past Medical History:  Diagnosis Date   Anal fissure    recurrent   Anal pain    Hemorrhoids     There are no problems to display for this patient.   Past Surgical History:  Procedure Laterality Date   LAPAROSCOPIC APPENDECTOMY  11-05-2009   @[facility]    SHOULDER ARTHROSCOPY Right 1992 approx.   TONSILLECTOMY  child   TYMPANOSTOMY TUBE PLACEMENT Bilateral several times as child   VASECTOMY  2008   in Dr office       No family history on file.  Social History   Tobacco Use   Smoking status: Former Smoker    Years: 28.00    Types: Cigarettes    Quit date: 03/14/2016    Years since quitting: 3.5   Smokeless tobacco: Former 05/14/2016    Types: Snuff, Chew    Quit date: 09/15/2018  Vaping Use   Vaping Use: Former   Quit date: 03/14/2016  Substance Use Topics   Alcohol use: Not Currently   Drug use: Not Currently    Comment: 03-15-2019  pt stated only used at age 67    Home Medications Prior to Admission medications   Medication Sig Start Date End Date Taking?  Authorizing Provider  buPROPion (WELLBUTRIN SR) 150 MG 12 hr tablet Take 150 mg by mouth 2 (two) times daily.    [provider]  hydrocortisone 2.5 % cream Apply topically 2 (two) times daily.    [provider]  oxyCODONE (OXY IR/ROXICODONE) 5 MG immediate release tablet Take 1 tablet (5 mg total) by mouth every 6 (six) hours as needed for severe pain. 03/18/19   05/18/19, MD    Allergies    Penicillins and Sulfa antibiotics  Review of Systems   Review of Systems  Constitutional: Negative for chills and fever.  HENT: Negative for ear pain and sore throat.   Eyes: Negative for pain and visual disturbance.  Respiratory: Negative for cough and shortness of breath.   Cardiovascular: Positive for chest pain. Negative for palpitations.  Gastrointestinal: Negative for abdominal pain and vomiting.  Genitourinary: Negative for dysuria and hematuria.  Musculoskeletal: Negative for arthralgias and back pain.  Skin: Negative for color change and rash.  Neurological: Negative for seizures and syncope.  All other systems reviewed and are negative.   Physical Exam Updated Vital Signs  ED Triage Vitals  Enc Vitals Group     BP 09/30/19 1025 (!) 139/93     Pulse Rate 09/30/19  1025 98     Resp 09/30/19 1025 16     Temp 09/30/19 1025 98.4 F (36.9 C)     Temp Source 09/30/19 1025 Oral     SpO2 09/30/19 1025 100 %     Weight 09/30/19 1021 200 lb (90.7 kg)     Height 09/30/19 1021 6' (1.829 m)     Head Circumference --      Peak Flow --      Pain Score 09/30/19 1021 0     Pain Loc --      Pain Edu? --      Excl. in GC? --     Physical Exam Vitals and nursing note reviewed.  Constitutional:      General: He is not in acute distress.    Appearance: He is well-developed. He is not ill-appearing.  HENT:     Head: Normocephalic and atraumatic.  Eyes:     Conjunctiva/sclera: Conjunctivae normal.     Pupils: Pupils are equal, round, and reactive to light.   Cardiovascular:     Rate and Rhythm: Normal rate and regular rhythm.     Pulses:          Radial pulses are 2+ on the right side and 2+ on the left side.     Heart sounds: Normal heart sounds. No murmur heard.   Pulmonary:     Effort: Pulmonary effort is normal. No respiratory distress.     Breath sounds: Normal breath sounds. No decreased breath sounds, wheezing or rhonchi.  Abdominal:     Palpations: Abdomen is soft.     Tenderness: There is no abdominal tenderness.  Musculoskeletal:        General: Normal range of motion.     Cervical back: Neck supple.     Right lower leg: No edema.     Left lower leg: No edema.  Skin:    General: Skin is warm and dry.     Capillary Refill: Capillary refill takes less than 2 seconds.  Neurological:     General: No focal deficit present.     Mental Status: He is alert.     ED Results / Procedures / Treatments   Labs (all labs ordered are listed, but only abnormal results are displayed) Labs Reviewed  CBC WITH DIFFERENTIAL/PLATELET - Abnormal; Notable for the following components:      Result Value   RDW 11.3 (*)    All other components within normal limits  BASIC METABOLIC PANEL - Abnormal; Notable for the following components:   Glucose, Bld 104 (*)    All other components within normal limits  HEPATIC FUNCTION PANEL - Abnormal; Notable for the following components:   ALT 53 (*)    Indirect Bilirubin 1.0 (*)    All other components within normal limits  LIPASE, BLOOD  D-DIMER, QUANTITATIVE (NOT AT [facility])  TROPONIN I (HIGH SENSITIVITY)  TROPONIN I (HIGH SENSITIVITY)    EKG EKG Interpretation  Date/Time:  Friday September 30 2019 10:21:06 EDT Ventricular Rate:  103 PR Interval:  140 QRS Duration: 76 QT Interval:  330 QTC Calculation: 432 R Axis:   -3 Text Interpretation: Sinus tachycardia Otherwise normal ECG Confirmed by Virgina Norfolk [phone]) on 09/30/2019 10:25:22 AM   Radiology DG Chest Portable 1 View  Result  Date: 09/30/2019 CLINICAL DATA:  Chest pain EXAM: PORTABLE CHEST 1 VIEW COMPARISON:  None. FINDINGS: The heart size and mediastinal contours are within normal limits. Both lungs are clear. The visualized skeletal  structures are unremarkable. IMPRESSION: No active disease. Electronically Signed   By: Marlan Palau M.D.   On: 09/30/2019 10:50    Procedures Procedures (including critical care time)  Medications Ordered in ED Medications - No data to display  ED Course  I have reviewed the triage vital signs and the nursing notes.  Pertinent labs & imaging results that were available during my care of the patient were reviewed by me and considered in my medical decision making (see chart for details).    MDM Rules/Calculators/A&P                          Kevin Bishop is a 49 year old male with no significant medical history presents to the ED with chest pain.  Normal vitals.  No fever.  EKG shows sinus tachycardia.  No ischemic changes.  No PE DVT risk factors with patient is tachycardic and will check D-dimer.  Heart score is 1 but will check serial troponins, chest x-ray.  History and physical overall not consistent with ACS but will evaluate.  He states that felt like gas pains mostly left-sided in his chest and got better when he passed gas.  No significant cardiac history in the family.  Does not have any abdominal tenderness on exam.  Overall appears low risk and will evaluate with troponins, basic labs, D-dimer, chest x-ray.  No Covid history, not vaccinated against coronavirus.  Not having any cough or shortness of breath.  Overall pain-free at this time.  Patient with normal troponin x2.  Doubt ACS.  Normal D-dimer, doubt PE.  No significant anemia, electrolyte abnormality, kidney injury.  Overall suspect GI related pain.  Possibly MSK related pain.  Recommend follow-up with primary care doctor and discharged from ED in good condition.  Understands return precautions.  This chart was  dictated using voice recognition software.  Despite best efforts to proofread,  errors can occur which can change the documentation meaning.    Final Clinical Impression(s) / ED Diagnoses Final diagnoses:  Atypical chest pain    Rx / DC Orders ED Discharge Orders    None       Virgina Norfolk, DO 09/30/19 1308

## 2019-09-30 NOTE — ED Triage Notes (Signed)
Pt arrives ambulatory to ED with c/o CP starting last night pt arrives with EKG from work which show NSR. EKG being collected in triage at this time. Pt describes the pain as being in his left chest under his left arm. Denies any NV, denies SOB.

## 2019-09-30 NOTE — Discharge Instructions (Signed)
Lab work today was unremarkable.  Please follow-up with your primary care doctor to further discuss today's visit.

## 2019-10-02 DIAGNOSIS — R079 Chest pain, unspecified: Secondary | ICD-10-CM | POA: Diagnosis not present

## 2020-01-20 DIAGNOSIS — R079 Chest pain, unspecified: Secondary | ICD-10-CM | POA: Diagnosis not present

## 2020-01-21 DIAGNOSIS — R079 Chest pain, unspecified: Secondary | ICD-10-CM | POA: Diagnosis not present

## 2020-01-25 ENCOUNTER — Telehealth: Payer: Self-pay

## 2020-01-25 NOTE — Telephone Encounter (Signed)
NOTES ON FILE FROM  [facility] [phone] SENT REFERRAL TO SCHEDULING

## 2020-02-06 DIAGNOSIS — F430 Acute stress reaction: Secondary | ICD-10-CM | POA: Diagnosis not present

## 2020-02-06 DIAGNOSIS — R079 Chest pain, unspecified: Secondary | ICD-10-CM | POA: Diagnosis not present

## 2020-02-07 NOTE — Progress Notes (Signed)
CARDIOLOGY CONSULT NOTE       Patient ID: Kevin Bishop MRN: [id number] DOB/AGE: 1970-01-27 50 y.o.  Admit date: (Not on file) Referring Physician: Farris Bishop  Primary Physician: [facility] Primary Cardiologist: Kevin Bishop Reason for Consultation: Chest Pain  Active Problems:   * No active hospital problems. *   HPI:  50 y.o. referred by Dr Kevin Bishop for chest pain.  Patient Bishop complained of pain across his chest Pain feels Muscular Intermittent over the last few weeks but now worse Precipitated by health issues with his parents b Feels like sore muscle Radiates to left arm but sometimes right. Seen in ER September 2021 for r/o Troponin negative CXR normal  He Bishop a history of ADD and depression  Former smoker quit in 2018 No other significant CRF;s  His dad is dying of stage for urologic cancer in [place] Mom lives in [place] and just had large abdominal mass removed Bishop older children and on 55 yo lives with him  Works at [employer] All other systems reviewed and negative except as noted above  Past Medical History:  Diagnosis Date  . Anal fissure    recurrent  . Anal pain   . Hemorrhoids     No family history on file.  Social History   Socioeconomic History  . Marital status: Married    Spouse name: Not on file  . Number of children: Not on file  . Years of education: Not on file  . Highest education level: Not on file  Occupational History  . Not on file  Tobacco Use  . Smoking status: Current Every Day Smoker    Years: 28.00    Types: Cigarettes    Last attempt to quit: 03/14/2016    Years since quitting: 3.9  . Smokeless tobacco: Former Neurosurgeon    Types: Snuff, Dorna Bloom    Quit date: 09/15/2018  Vaping Use  . Vaping Use: Former  . Quit date: 03/14/2016  Substance and Sexual Activity  . Alcohol use: Not Currently  . Drug use: Not Currently    Comment: 03-15-2019  pt stated only used at age 66  . Sexual activity: Not on file    Comment:  vasectomy  Other Topics Concern  . Not on file  Social History Narrative  . Not on file   Social Determinants of Health   Financial Resource Strain: Not on file  Food Insecurity: Not on file  Transportation Needs: Not on file  Physical Activity: Not on file  Stress: Not on file  Social Connections: Not on file  Intimate Partner Violence: Not on file    Past Surgical History:  Procedure Laterality Date  . LAPAROSCOPIC APPENDECTOMY  11-05-2009   @[facility]   . SHOULDER ARTHROSCOPY Right 1992 approx.  . TONSILLECTOMY  child  . TYMPANOSTOMY TUBE PLACEMENT Bilateral several times as child  . VASECTOMY  2008   in Dr office      Current Outpatient Medications:  .  buPROPion (WELLBUTRIN SR) 150 MG 12 hr tablet, Take 150 mg by mouth 2 (two) times daily., Disp: , Rfl:  .  hydrocortisone 2.5 % cream, Apply topically 2 (two) times daily., Disp: , Rfl:  .  hydrOXYzine (ATARAX/VISTARIL) 50 MG tablet, 1/2 to 1 tablet as needed for anxiety, Disp: , Rfl:  .  oxyCODONE (OXY IR/ROXICODONE) 5 MG immediate release tablet, Take 1 tablet (5 mg total) by mouth every 6 (six) hours as needed for severe pain., Disp: 15 tablet, Rfl:  0    Physical Exam: Blood pressure 126/72, pulse 99, height 6' (1.829 m), weight 96 kg, SpO2 96 %.   Affect appropriate Healthy:  appears stated age HEENT: normal Neck supple with no adenopathy JVP normal no bruits no thyromegaly Lungs clear with no wheezing and good diaphragmatic motion Heart:  S1/S2 no murmur, no rub, gallop or click PMI normal Abdomen: benighn, BS positve, no tenderness, no AAA no bruit.  No HSM or HJR post appendectomy  Distal pulses intact with no bruits No edema Neuro non-focal Skin warm and dry No muscular weakness   Labs:   Lab Results  Component Value Date   WBC 6.3 09/30/2019   HGB 15.9 09/30/2019   HCT 44.9 09/30/2019   MCV 91.8 09/30/2019   PLT 150 09/30/2019   No results for input(s): NA, K, CL, CO2, BUN, CREATININE, CALCIUM,  PROT, BILITOT, ALKPHOS, ALT, AST, GLUCOSE in the last 168 hours.  Invalid input(s): LABALBU No results found for: CKTOTAL, CKMB, CKMBINDEX, TROPONINI No results found for: CHOL No results found for: HDL No results found for: LDLCALC No results found for: TRIG No results found for: CHOLHDL No results found for: LDLDIRECT    Radiology: No results found.  EKG: SR rate 103 normal    ASSESSMENT AND PLAN:   1. Chest pain:  Atypical normal ECG F/U ETT. And calcium score  2. ADD:  Not on meds f/u primary   Signed: Charlton Bishop 02/10/2020, 4:00 PM

## 2020-02-10 ENCOUNTER — Encounter: Payer: Self-pay | Admitting: Cardiovascular Disease

## 2020-02-10 ENCOUNTER — Other Ambulatory Visit: Payer: Self-pay

## 2020-02-10 ENCOUNTER — Ambulatory Visit: Payer: BC Managed Care – PPO | Admitting: Cardiovascular Disease

## 2020-02-10 VITALS — BP 126/72 | HR 99 | Ht 72.0 in | Wt 211.6 lb

## 2020-02-10 DIAGNOSIS — R079 Chest pain, unspecified: Secondary | ICD-10-CM | POA: Diagnosis not present

## 2020-02-10 NOTE — Patient Instructions (Signed)
Medication Instructions:  *If you need a refill on your cardiac medications before your next appointment, please call your pharmacy*  Lab Work: If you have labs (blood work) drawn today and your tests are completely normal, you will receive your results only by: Marland Kitchen MyChart Message (if you have MyChart) OR . A paper copy in the mail If you have any lab test that is abnormal or we need to change your treatment, we will call you to review the results.  Testing/Procedures: Your physician has requested that you have an exercise tolerance test. For further information please visit [email or web address] Please also follow instruction sheet, as given.  Cardiac CT scanning for calcium score, (CAT scanning), is a noninvasive, special x-ray that produces cross-sectional images of the body using x-rays and a computer. CT scans help physicians diagnose and treat medical conditions. For some CT exams, a contrast material is used to enhance visibility in the area of the body being studied. CT scans provide greater clarity and reveal more details than regular x-ray exams.  Follow-Up: At [facility], you and your health needs are our priority.  As part of our continuing mission to provide you with exceptional heart care, we have created designated Provider Care Teams.  These Care Teams include your primary Cardiologist (physician) and Advanced Practice Providers (APPs -  Physician Assistants and Nurse Practitioners) who all work together to provide you with the care you need, when you need it.  We recommend signing up for the patient portal called "MyChart".  Sign up information is provided on this After Visit Summary.  MyChart is used to connect with patients for Virtual Visits (Telemedicine).  Patients are able to view lab/test results, encounter notes, upcoming appointments, etc.  Non-urgent messages can be sent to your provider as well.   To learn more about what you can do with MyChart, go to  [email or web address].    Your next appointment:   As needed  The format for your next appointment:   In Person  Provider:   You may see Dr. Eden Emms or one of the following Advanced Practice Providers on your designated Care Team:    Georgie Chard, NP

## 2020-03-17 ENCOUNTER — Other Ambulatory Visit (HOSPITAL_COMMUNITY): Payer: BC Managed Care – PPO

## 2020-03-20 ENCOUNTER — Other Ambulatory Visit: Payer: BC Managed Care – PPO

## 2020-03-20 ENCOUNTER — Other Ambulatory Visit: Payer: Self-pay

## 2020-03-20 ENCOUNTER — Ambulatory Visit (INDEPENDENT_AMBULATORY_CARE_PROVIDER_SITE_OTHER)
Admission: RE | Admit: 2020-03-20 | Discharge: 2020-03-20 | Disposition: A | Discharge status: Home / Self Care | Payer: Self-pay | Source: Ambulatory Visit | Attending: Cardiovascular Disease | Admitting: Cardiovascular Disease

## 2020-03-20 DIAGNOSIS — R079 Chest pain, unspecified: Secondary | ICD-10-CM

## 2020-03-22 ENCOUNTER — Telehealth: Payer: Self-pay

## 2020-03-22 DIAGNOSIS — R079 Chest pain, unspecified: Secondary | ICD-10-CM

## 2020-03-22 NOTE — Telephone Encounter (Signed)
The patient has been notified of the result and verbalized understanding.  All questions (if any) were answered. Ethelda Chick, RN 03/22/2020 1:58 PM   Patient will get stress test scheduled soon, test has been delayed due to equipment not working at this time. Patient will come in on Monday for lipid panel.

## 2020-03-22 NOTE — Telephone Encounter (Signed)
-----   Message from Wendall Stade, MD sent at 03/21/2020  3:45 PM EST ----- Calcium score high for age. Needs his ETT still Check lipids will need to be on statin if LDL >100

## 2020-04-07 ENCOUNTER — Other Ambulatory Visit (HOSPITAL_COMMUNITY)
Admission: RE | Admit: 2020-04-07 | Discharge: 2020-04-07 | Disposition: A | Discharge status: Home / Self Care | Payer: BC Managed Care – PPO | Source: Ambulatory Visit | Attending: Cardiovascular Disease | Admitting: Cardiovascular Disease

## 2020-04-07 DIAGNOSIS — Z01812 Encounter for preprocedural laboratory examination: Secondary | ICD-10-CM | POA: Insufficient documentation

## 2020-04-07 DIAGNOSIS — Z20822 Contact with and (suspected) exposure to covid-19: Secondary | ICD-10-CM | POA: Insufficient documentation

## 2020-04-07 LAB — SARS CORONAVIRUS 2 (TAT 6-24 HRS): SARS Coronavirus 2: NEGATIVE

## 2020-04-10 ENCOUNTER — Ambulatory Visit (INDEPENDENT_AMBULATORY_CARE_PROVIDER_SITE_OTHER): Payer: BC Managed Care – PPO

## 2020-04-10 ENCOUNTER — Other Ambulatory Visit: Payer: BC Managed Care – PPO

## 2020-04-10 ENCOUNTER — Other Ambulatory Visit: Payer: Self-pay

## 2020-04-10 DIAGNOSIS — R079 Chest pain, unspecified: Secondary | ICD-10-CM

## 2020-04-10 LAB — HEPATIC FUNCTION PANEL
ALT: 34 IU/L (ref 0–44)
AST: 23 IU/L (ref 0–40)
Albumin: 4.6 g/dL (ref 4.0–5.0)
Alkaline Phosphatase: 77 IU/L (ref 44–121)
Bilirubin Total: 1 mg/dL (ref 0.0–1.2)
Bilirubin, Direct: 0.23 mg/dL (ref 0.00–0.40)
Total Protein: 6.8 g/dL (ref 6.0–8.5)

## 2020-04-10 LAB — EXERCISE TOLERANCE TEST
Estimated workload: 8.5 METS
Exercise duration (min): 7 min
Exercise duration (sec): 0 s
MPHR: 172 {beats}/min
Peak HR: 169 {beats}/min
Percent HR: 98 %
RPE: 15
Rest HR: 96 {beats}/min

## 2020-04-10 LAB — LIPID PANEL
Chol/HDL Ratio: 3.6 ratio (ref 0.0–5.0)
Cholesterol, Total: 142 mg/dL (ref 100–199)
HDL: 39 mg/dL — ABNORMAL LOW (ref >39)
LDL Chol Calc (NIH): 90 mg/dL (ref 0–99)
Triglycerides: 66 mg/dL (ref 0–149)
VLDL Cholesterol Cal: 13 mg/dL (ref 5–40)

## 2020-04-11 ENCOUNTER — Telehealth: Payer: Self-pay | Admitting: Cardiovascular Disease

## 2020-04-11 NOTE — Telephone Encounter (Signed)
Returned call to pt to discuss stress test / lab results. See result notes.

## 2020-04-11 NOTE — Telephone Encounter (Signed)
Follow Up:    Pt is returning call from this morning, concerning his stress test results.

## 2020-04-11 NOTE — Telephone Encounter (Signed)
-----   Message from Wendall Stade, MD sent at 04/10/2020  5:12 PM EDT ----- Normal ETT BP was normal during office visit Should monitor at home

## 2020-04-22 DIAGNOSIS — Z20822 Contact with and (suspected) exposure to covid-19: Secondary | ICD-10-CM | POA: Diagnosis not present

## 2020-08-13 ENCOUNTER — Ambulatory Visit: Payer: BC Managed Care – PPO | Admitting: Cardiovascular Disease

## 2020-08-14 DIAGNOSIS — D1730 Benign lipomatous neoplasm of skin and subcutaneous tissue of unspecified sites: Secondary | ICD-10-CM | POA: Diagnosis not present

## 2020-08-27 DIAGNOSIS — Z0000 Encounter for general adult medical examination without abnormal findings: Secondary | ICD-10-CM | POA: Diagnosis not present

## 2020-08-27 DIAGNOSIS — Z125 Encounter for screening for malignant neoplasm of prostate: Secondary | ICD-10-CM | POA: Diagnosis not present

## 2020-08-27 DIAGNOSIS — Z13220 Encounter for screening for lipoid disorders: Secondary | ICD-10-CM | POA: Diagnosis not present

## 2020-09-21 DIAGNOSIS — Z1211 Encounter for screening for malignant neoplasm of colon: Secondary | ICD-10-CM | POA: Diagnosis not present

## 2021-01-15 NOTE — Progress Notes (Signed)
CARDIOLOGY CONSULT NOTE       Patient ID: Kevin Bishop MRN: [id number] DOB/AGE: Feb 05, 1970 51 y.o.  Admit date: (Not on file) Referring Physician: Farris Has  Primary Physician: [facility] Primary Cardiologist: New Reason for Consultation: Chest Pain  Active Problems:   * No active hospital problems. *   HPI:  51 y.o. referred by Dr Kateri Plummer 02/10/20 for chest pain.  Patient has complained of pain across his chest Pain feels Muscular Intermittent over the last few weeks but now worse Precipitated by health issues with his parents  Feels like sore muscle Radiates to left arm but sometimes right. Seen in ER September 2021 for r/o Troponin negative CXR normal  He has a history of ADD and depression  Former smoker quit in 2018 No other significant CRF;s  Dad sick with  urologic cancer in [place] Mom passed in [place] this year  Has older children and one 59 yo lives with him goes to [place] Has total five kids oldest is 43   Works at [employer] looking for other jobs in [place]  ETT 04/10/20 normal HTN response to exercise Calcium score 03/20/20 84 / 90 th percentile for age / sex  labs 04/10/20 LDL 90 Did not want to start statin wanted to try diet / exercise to lower LDL to target < 70   Still resistant to taking statin   ROS All other systems reviewed and negative except as noted above  Past Medical History:  Diagnosis Date   Anal fissure    recurrent   Anal pain    Hemorrhoids     No family history on file.  Social History   Socioeconomic History   Marital status: Married    Spouse name: Not on file   Number of children: Not on file   Years of education: Not on file   Highest education level: Not on file  Occupational History   Not on file  Tobacco Use   Smoking status: Every Day    Years: 28.00    Types: Cigarettes    Last attempt to quit: 03/14/2016    Years since quitting: 4.8   Smokeless tobacco: Former    Types: Snuff, Chew    Quit date:  09/15/2018  Vaping Use   Vaping Use: Former   Quit date: 03/14/2016  Substance and Sexual Activity   Alcohol use: Not Currently   Drug use: Not Currently    Comment: 03-15-2019  pt stated only used at age 51   Sexual activity: Not on file    Comment: vasectomy  Other Topics Concern   Not on file  Social History Narrative   Not on file   Social Determinants of Health   Financial Resource Strain: Not on file  Food Insecurity: Not on file  Transportation Needs: Not on file  Physical Activity: Not on file  Stress: Not on file  Social Connections: Not on file  Intimate Partner Violence: Not on file    Past Surgical History:  Procedure Laterality Date   LAPAROSCOPIC APPENDECTOMY  11-05-2009   @[facility]    SHOULDER ARTHROSCOPY Right 1992 approx.   TONSILLECTOMY  child   TYMPANOSTOMY TUBE PLACEMENT Bilateral several times as child   VASECTOMY  2008   in Dr office      Current Outpatient Medications:    buPROPion ER (WELLBUTRIN SR) 100 MG 12 hr tablet, Take 1 tablet by mouth 2 (two) times daily., Disp: , Rfl:    hydrocortisone 2.5 % cream,  Apply topically 2 (two) times daily. (Patient not taking: Reported on 01/28/2021), Disp: , Rfl:    hydrOXYzine (ATARAX/VISTARIL) 50 MG tablet, 1/2 to 1 tablet as needed for anxiety (Patient not taking: Reported on 01/28/2021), Disp: , Rfl:    oxyCODONE (OXY IR/ROXICODONE) 5 MG immediate release tablet, Take 1 tablet (5 mg total) by mouth every 6 (six) hours as needed for severe pain. (Patient not taking: Reported on 01/28/2021), Disp: 15 tablet, Rfl: 0    Physical Exam: Blood pressure 122/88, pulse 98, height 6' (1.829 m), weight 212 lb (96.2 kg), SpO2 99 %.   Affect appropriate Healthy:  appears stated age HEENT: normal Neck supple with no adenopathy JVP normal no bruits no thyromegaly Lungs clear with no wheezing and good diaphragmatic motion Heart:  S1/S2 no murmur, no rub, gallop or click PMI normal Abdomen: benighn, BS positve, no tenderness,  no AAA no bruit.  No HSM or HJR post appendectomy  Distal pulses intact with no bruits No edema Neuro non-focal Skin warm and dry No muscular weakness   Labs:   Lab Results  Component Value Date   WBC 6.3 09/30/2019   HGB 15.9 09/30/2019   HCT 44.9 09/30/2019   MCV 91.8 09/30/2019   PLT 150 09/30/2019   No results for input(s): NA, K, CL, CO2, BUN, CREATININE, CALCIUM, PROT, BILITOT, ALKPHOS, ALT, AST, GLUCOSE in the last 168 hours.  Invalid input(s): LABALBU No results found for: CKTOTAL, CKMB, CKMBINDEX, TROPONINI  Lab Results  Component Value Date   CHOL 142 04/10/2020   Lab Results  Component Value Date   HDL 39 (L) 04/10/2020   Lab Results  Component Value Date   LDLCALC 90 04/10/2020   Lab Results  Component Value Date   TRIG 66 04/10/2020   Lab Results  Component Value Date   CHOLHDL 3.6 04/10/2020   No results found for: LDLDIRECT    Radiology: No results found.  EKG: SR rate 103 normal 01/28/2021 NSR rate 89 normal    ASSESSMENT AND PLAN:   1. Chest pain:  Atypical normal ECG , ETT 04/10/20 High calcium score for age ASA and statin advised LDL 90 Repeat labs today  2. ADD:  Not on meds f/u primary  3. BP:  Diet and exercise good range in office today    F/U in a year   Signed: Charlton Haws 01/28/2021, 8:35 AM

## 2021-01-28 ENCOUNTER — Other Ambulatory Visit: Payer: Self-pay

## 2021-01-28 ENCOUNTER — Ambulatory Visit (INDEPENDENT_AMBULATORY_CARE_PROVIDER_SITE_OTHER): Payer: BC Managed Care – PPO | Admitting: Cardiovascular Disease

## 2021-01-28 VITALS — BP 122/88 | HR 98 | Ht 72.0 in | Wt 212.0 lb

## 2021-01-28 DIAGNOSIS — R931 Abnormal findings on diagnostic imaging of heart and coronary circulation: Secondary | ICD-10-CM

## 2021-01-28 DIAGNOSIS — R079 Chest pain, unspecified: Secondary | ICD-10-CM | POA: Diagnosis not present

## 2021-01-28 LAB — LIPID PANEL
Chol/HDL Ratio: 4.8 ratio (ref 0.0–5.0)
Cholesterol, Total: 158 mg/dL (ref 100–199)
HDL: 33 mg/dL — ABNORMAL LOW (ref >39)
LDL Chol Calc (NIH): 108 mg/dL — ABNORMAL HIGH (ref 0–99)
Triglycerides: 89 mg/dL (ref 0–149)
VLDL Cholesterol Cal: 17 mg/dL (ref 5–40)

## 2021-01-28 LAB — HEPATIC FUNCTION PANEL
ALT: 60 IU/L — ABNORMAL HIGH (ref 0–44)
AST: 28 IU/L (ref 0–40)
Albumin: 4.6 g/dL (ref 4.0–5.0)
Alkaline Phosphatase: 77 IU/L (ref 44–121)
Bilirubin Total: 1 mg/dL (ref 0.0–1.2)
Bilirubin, Direct: 0.21 mg/dL (ref 0.00–0.40)
Total Protein: 6.6 g/dL (ref 6.0–8.5)

## 2021-01-28 NOTE — Patient Instructions (Signed)
Medication Instructions:  Your physician recommends that you continue on your current medications as directed. Please refer to the Current Medication list given to you today.  *If you need a refill on your cardiac medications before your next appointment, please call your pharmacy*   Lab Work: Your physician recommends that you have lab work today- Lipid and Liver Panel  If you have labs (blood work) drawn today and your tests are completely normal, you will receive your results only by: MyChart Message (if you have MyChart) OR A paper copy in the mail If you have any lab test that is abnormal or we need to change your treatment, we will call you to review the results.  Follow-Up: At [facility], you and your health needs are our priority.  As part of our continuing mission to provide you with exceptional heart care, we have created designated Provider Care Teams.  These Care Teams include your primary Cardiologist (physician) and Advanced Practice Providers (APPs -  Physician Assistants and Nurse Practitioners) who all work together to provide you with the care you need, when you need it.  We recommend signing up for the patient portal called "MyChart".  Sign up information is provided on this After Visit Summary.  MyChart is used to connect with patients for Virtual Visits (Telemedicine).  Patients are able to view lab/test results, encounter notes, upcoming appointments, etc.  Non-urgent messages can be sent to your provider as well.   To learn more about what you can do with MyChart, go to [email or web address].    Your next appointment:   1 year(s)  The format for your next appointment:   In Person  Provider:  1}  Dr. Johnsie Cancel

## 2021-02-25 DIAGNOSIS — S61210A Laceration without foreign body of right index finger without damage to nail, initial encounter: Secondary | ICD-10-CM | POA: Diagnosis not present

## 2021-09-23 DIAGNOSIS — Z0000 Encounter for general adult medical examination without abnormal findings: Secondary | ICD-10-CM | POA: Diagnosis not present

## 2021-09-23 DIAGNOSIS — Z13220 Encounter for screening for lipoid disorders: Secondary | ICD-10-CM | POA: Diagnosis not present

## 2021-09-23 DIAGNOSIS — Z1211 Encounter for screening for malignant neoplasm of colon: Secondary | ICD-10-CM | POA: Diagnosis not present

## 2021-09-23 DIAGNOSIS — Z125 Encounter for screening for malignant neoplasm of prostate: Secondary | ICD-10-CM | POA: Diagnosis not present

## 2021-09-23 DIAGNOSIS — F39 Unspecified mood [affective] disorder: Secondary | ICD-10-CM | POA: Diagnosis not present

## 2021-10-09 DIAGNOSIS — Z1211 Encounter for screening for malignant neoplasm of colon: Secondary | ICD-10-CM | POA: Diagnosis not present

## 2022-05-12 DIAGNOSIS — H6993 Unspecified Eustachian tube disorder, bilateral: Secondary | ICD-10-CM | POA: Diagnosis not present

## 2022-05-12 DIAGNOSIS — J029 Acute pharyngitis, unspecified: Secondary | ICD-10-CM | POA: Diagnosis not present

## 2022-05-12 DIAGNOSIS — R0981 Nasal congestion: Secondary | ICD-10-CM | POA: Diagnosis not present

## 2022-05-27 DIAGNOSIS — R051 Acute cough: Secondary | ICD-10-CM | POA: Diagnosis not present

## 2022-05-27 DIAGNOSIS — H6993 Unspecified Eustachian tube disorder, bilateral: Secondary | ICD-10-CM | POA: Diagnosis not present

## 2022-05-27 DIAGNOSIS — R0981 Nasal congestion: Secondary | ICD-10-CM | POA: Diagnosis not present

## 2022-07-07 IMAGING — CT CT CARDIAC CORONARY ARTERY CALCIUM SCORE
3 series · 14 of 20 positions shown, 15 images · non-contrast
Comparison: None.
COMPARISON: None.

Addendum:
EXAM:
OVER-READ INTERPRETATION  CT CHEST

The following report is an over-read performed by radiologist Dr.
Eustaquio Poulsen [REDACTED] on 03/20/2020. This
over-read does not include interpretation of cardiac or coronary
anatomy or pathology. The coronary calcium score interpretation by
the cardiologist is attached.
CLINICAL DATA: Risk stratification
Coronary Calcium Score
TECHNIQUE: The patient was scanned on a Siemens Somatom 64 slice scanner. Axial
non-contrast 3 mm slices were carried out through the heart. The
data set was analyzed on a dedicated work station and scored using
the Agatson method.

[Series 2: casc 3.0 bv41 2 bestsyst 36 % · axial · 0.38mm/px · z∈[-242,-161]mm · 4 of 45 slices shown, 5 images]
[im 9/45  vessel]
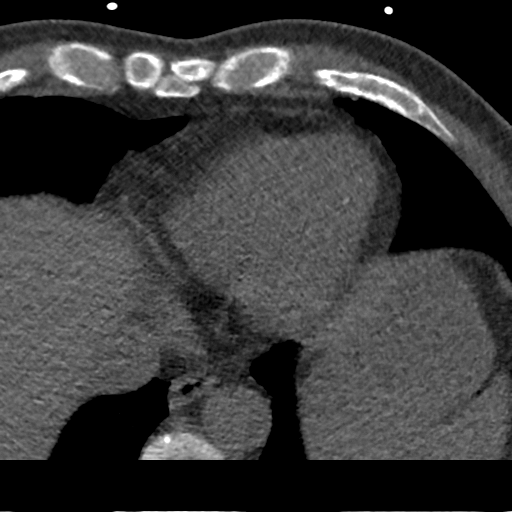
[im 9/45  lung]
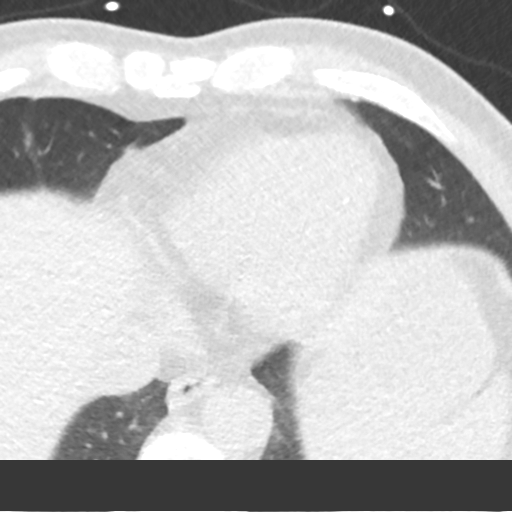
[im 18/45  vessel]
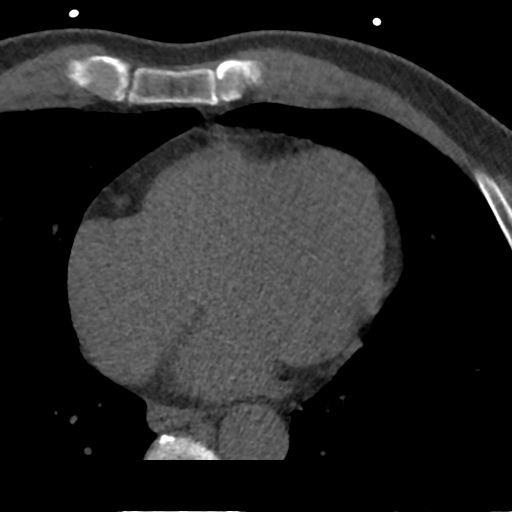
[im 27/45  vessel]
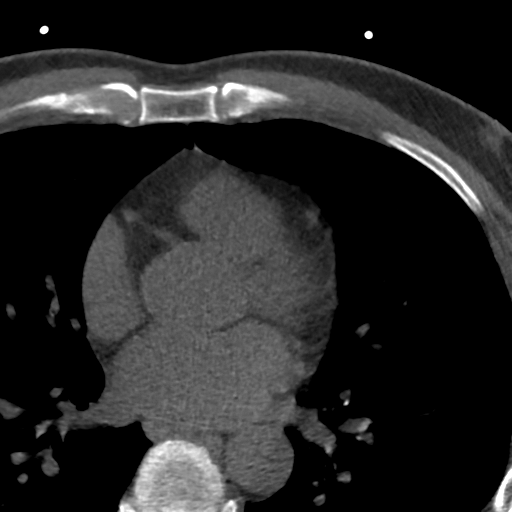
[im 36/45  vessel]
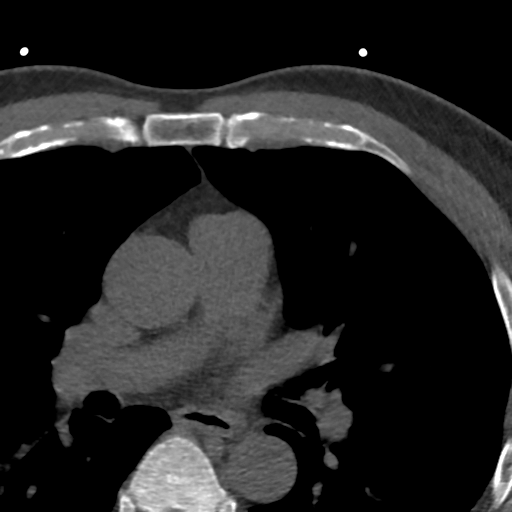

[Series 3: lung 37 % · axial · 0.70mm/px · z∈[-245,-158]mm · 5 of 45 slices shown]
[im 8/45  lung]
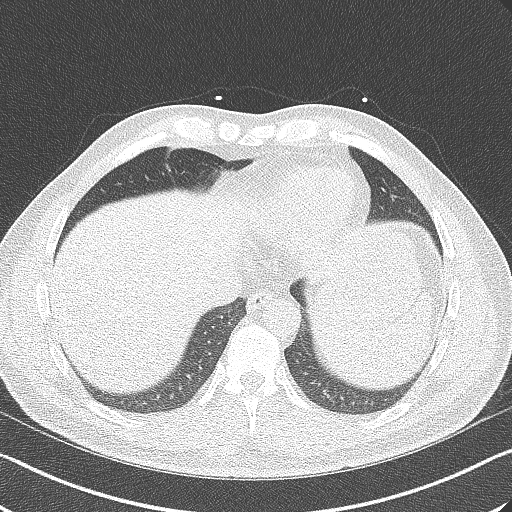
[im 15/45  lung]
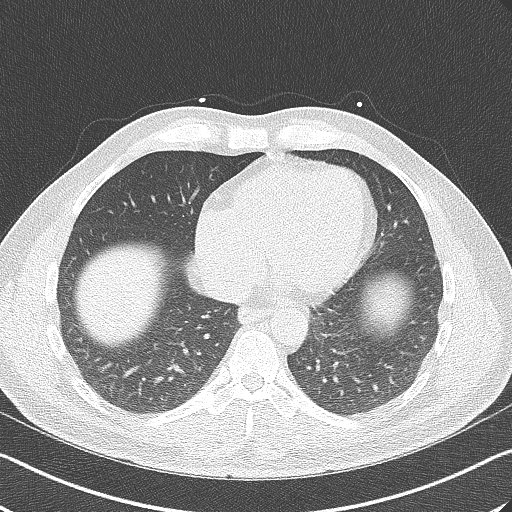
[im 23/45  lung]
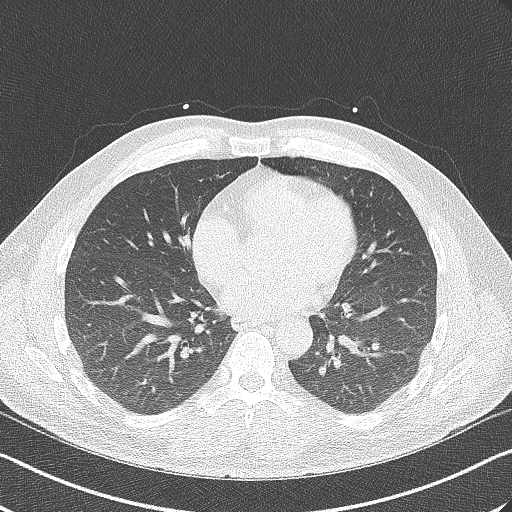
[im 30/45  lung]
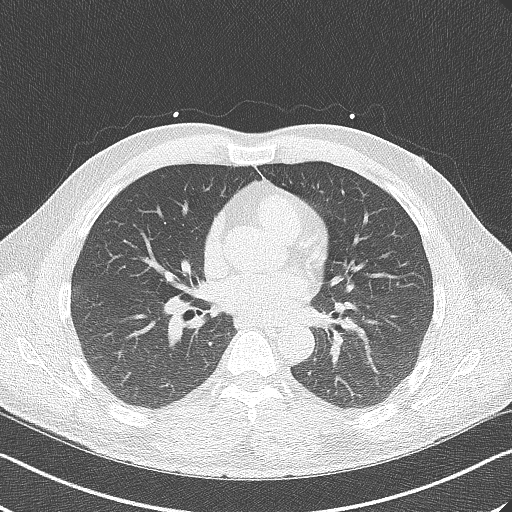
[im 37/45  lung]
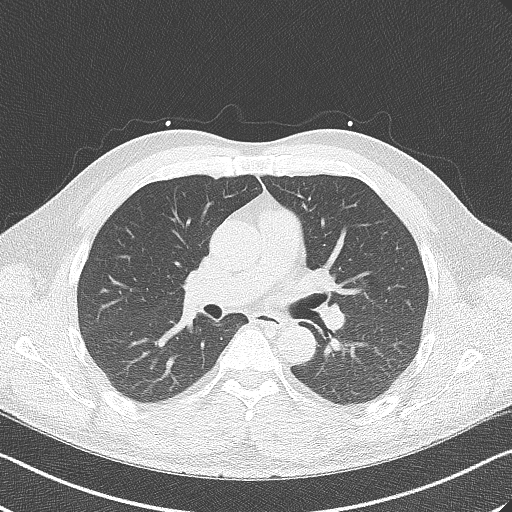

[Series 4: lung st 37 % · axial · 0.70mm/px · z∈[-245,-158]mm · 5 of 45 slices shown]
[im 8/45  lung]
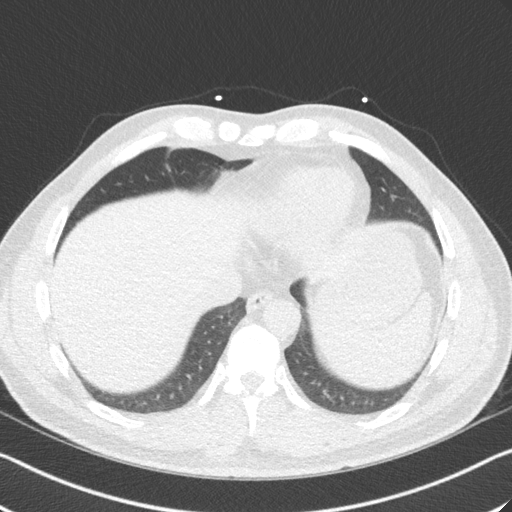
[im 15/45  lung]
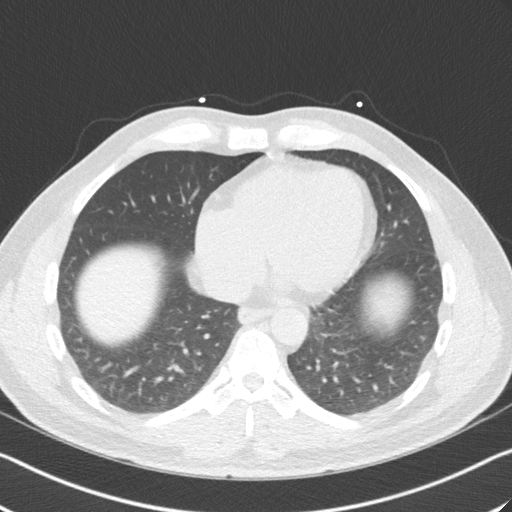
[im 23/45  lung]
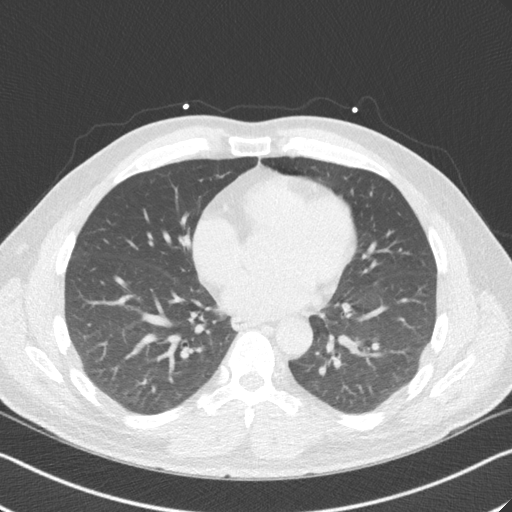
[im 30/45  lung]
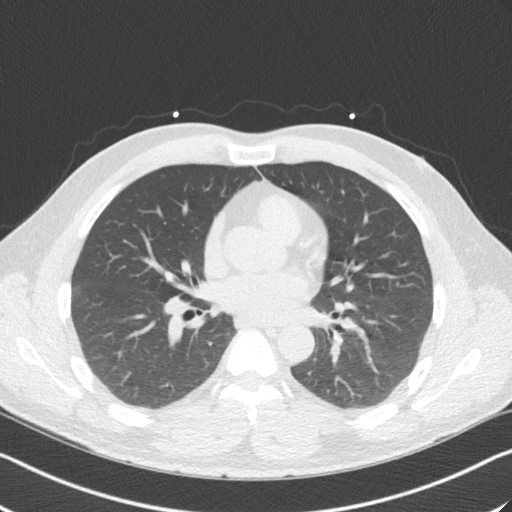
[im 37/45  lung]
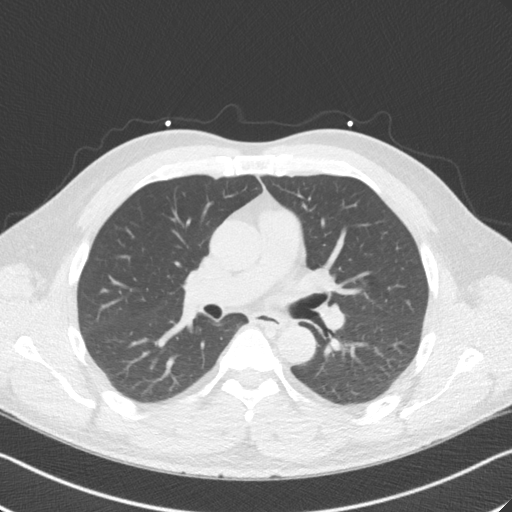

[14 of 20 positions shown; findings below may reference images not displayed]

FINDINGS: Within the visualized portions of the thorax there are no suspicious
appearing pulmonary nodules or masses, there is no acute
consolidative airspace disease, no pleural effusions, no
pneumothorax and no lymphadenopathy. Visualized portions of the
upper abdomen are unremarkable. There are no aggressive appearing
lytic or blastic lesions noted in the visualized portions of the
skeleton.
IMPRESSION: No significant incidental noncardiac findings are noted.
FINDINGS: Non-cardiac: See separate report from [REDACTED].

Ascending aorta: Normal size

Pericardium: Normal

Coronary arteries: Normal origin
IMPRESSION: Coronary calcium score of 84. This was 90 percentile for age and sex
matched control.

Blade Aujla

*** End of Addendum ***
EXAM:
OVER-READ INTERPRETATION  CT CHEST

The following report is an over-read performed by radiologist Dr.
Eustaquio Poulsen [REDACTED] on 03/20/2020. This
over-read does not include interpretation of cardiac or coronary
anatomy or pathology. The coronary calcium score interpretation by
the cardiologist is attached.
FINDINGS: Within the visualized portions of the thorax there are no suspicious
appearing pulmonary nodules or masses, there is no acute
consolidative airspace disease, no pleural effusions, no
pneumothorax and no lymphadenopathy. Visualized portions of the
upper abdomen are unremarkable. There are no aggressive appearing
lytic or blastic lesions noted in the visualized portions of the
skeleton.
IMPRESSION: No significant incidental noncardiac findings are noted.

## 2022-09-26 DIAGNOSIS — F39 Unspecified mood [affective] disorder: Secondary | ICD-10-CM | POA: Diagnosis not present

## 2022-09-26 DIAGNOSIS — Z125 Encounter for screening for malignant neoplasm of prostate: Secondary | ICD-10-CM | POA: Diagnosis not present

## 2022-09-26 DIAGNOSIS — E291 Testicular hypofunction: Secondary | ICD-10-CM | POA: Diagnosis not present

## 2022-09-26 DIAGNOSIS — Z0000 Encounter for general adult medical examination without abnormal findings: Secondary | ICD-10-CM | POA: Diagnosis not present

## 2022-09-26 DIAGNOSIS — Z1211 Encounter for screening for malignant neoplasm of colon: Secondary | ICD-10-CM | POA: Diagnosis not present

## 2022-09-26 DIAGNOSIS — L309 Dermatitis, unspecified: Secondary | ICD-10-CM | POA: Diagnosis not present

## 2022-09-29 DIAGNOSIS — Z1211 Encounter for screening for malignant neoplasm of colon: Secondary | ICD-10-CM | POA: Diagnosis not present

## 2023-01-29 DIAGNOSIS — J069 Acute upper respiratory infection, unspecified: Secondary | ICD-10-CM | POA: Diagnosis not present

## 2023-05-22 DIAGNOSIS — M6208 Separation of muscle (nontraumatic), other site: Secondary | ICD-10-CM | POA: Diagnosis not present

## 2023-05-22 DIAGNOSIS — F39 Unspecified mood [affective] disorder: Secondary | ICD-10-CM | POA: Diagnosis not present

## 2023-11-30 DIAGNOSIS — Z1211 Encounter for screening for malignant neoplasm of colon: Secondary | ICD-10-CM | POA: Diagnosis not present

## 2023-11-30 DIAGNOSIS — Z125 Encounter for screening for malignant neoplasm of prostate: Secondary | ICD-10-CM | POA: Diagnosis not present

## 2023-11-30 DIAGNOSIS — Z0000 Encounter for general adult medical examination without abnormal findings: Secondary | ICD-10-CM | POA: Diagnosis not present

## 2023-11-30 DIAGNOSIS — R030 Elevated blood-pressure reading, without diagnosis of hypertension: Secondary | ICD-10-CM | POA: Diagnosis not present

## 2023-12-08 DIAGNOSIS — Z1211 Encounter for screening for malignant neoplasm of colon: Secondary | ICD-10-CM | POA: Diagnosis not present
